# Patient Record
Sex: Female | Born: 1985 | Race: White | Hispanic: No | Marital: Married | Smoking: Never smoker
Health system: Southern US, Community
[De-identification: ages and names within clinical notes are randomized; demographics above are authoritative.]

## PROBLEM LIST (undated history)

## (undated) ENCOUNTER — Inpatient Hospital Stay (HOSPITAL_COMMUNITY): Payer: Self-pay

## (undated) DIAGNOSIS — K219 Gastro-esophageal reflux disease without esophagitis: Secondary | ICD-10-CM

## (undated) DIAGNOSIS — O24419 Gestational diabetes mellitus in pregnancy, unspecified control: Secondary | ICD-10-CM

## (undated) DIAGNOSIS — G43109 Migraine with aura, not intractable, without status migrainosus: Secondary | ICD-10-CM

## (undated) DIAGNOSIS — Z8759 Personal history of other complications of pregnancy, childbirth and the puerperium: Secondary | ICD-10-CM

## (undated) DIAGNOSIS — Z319 Encounter for procreative management, unspecified: Secondary | ICD-10-CM

## (undated) DIAGNOSIS — R87629 Unspecified abnormal cytological findings in specimens from vagina: Secondary | ICD-10-CM

## (undated) DIAGNOSIS — F419 Anxiety disorder, unspecified: Secondary | ICD-10-CM

## (undated) DIAGNOSIS — Z8619 Personal history of other infectious and parasitic diseases: Secondary | ICD-10-CM

## (undated) DIAGNOSIS — K2960 Other gastritis without bleeding: Secondary | ICD-10-CM

## (undated) DIAGNOSIS — I10 Essential (primary) hypertension: Secondary | ICD-10-CM

## (undated) HISTORY — PX: LEEP: SHX91

## (undated) HISTORY — DX: Other gastritis without bleeding: K29.60

## (undated) HISTORY — DX: Migraine with aura, not intractable, without status migrainosus: G43.109

## (undated) HISTORY — DX: Personal history of other infectious and parasitic diseases: Z86.19

## (undated) HISTORY — PX: TONSILLECTOMY: SUR1361

## (undated) HISTORY — DX: Essential (primary) hypertension: I10

## (undated) HISTORY — PX: DILATION AND CURETTAGE OF UTERUS: SHX78

## (undated) HISTORY — DX: Gestational diabetes mellitus in pregnancy, unspecified control: O24.419

## (undated) HISTORY — DX: Unspecified abnormal cytological findings in specimens from vagina: R87.629

## (undated) HISTORY — PX: PARTIAL HYSTERECTOMY: SHX80

## (undated) HISTORY — DX: Personal history of other complications of pregnancy, childbirth and the puerperium: Z87.59

---

## 2012-10-27 NOTE — L&D Delivery Note (Signed)
Delivery Note  SVD viable female Apgars 8,9 over 2nd degree ML lac.  Placenta delivered spontaneously intact with 3VC. Repair with 2-0 Chromic with good support and hemostasis noted and R/V exam confirms.  PH art was sent.  [place] cord blood was not done.  Mother and baby were doing well.  EBL 300cc  Cheikh Bramble, MD 

## 2013-02-08 ENCOUNTER — Encounter (HOSPITAL_COMMUNITY): Payer: Self-pay | Admitting: *Deleted

## 2013-02-08 ENCOUNTER — Inpatient Hospital Stay (HOSPITAL_COMMUNITY)
Admission: AD | Admit: 2013-02-08 | Discharge: 2013-02-08 | Disposition: A | Discharge status: Home / Self Care | Payer: No Typology Code available for payment source | Source: Ambulatory Visit | Attending: Obstetrics and Gynecology | Admitting: Obstetrics and Gynecology

## 2013-02-08 DIAGNOSIS — O99891 Other specified diseases and conditions complicating pregnancy: Secondary | ICD-10-CM | POA: Insufficient documentation

## 2013-02-08 DIAGNOSIS — A084 Viral intestinal infection, unspecified: Secondary | ICD-10-CM

## 2013-02-08 DIAGNOSIS — K5289 Other specified noninfective gastroenteritis and colitis: Secondary | ICD-10-CM | POA: Insufficient documentation

## 2013-02-08 DIAGNOSIS — A088 Other specified intestinal infections: Secondary | ICD-10-CM

## 2013-02-08 DIAGNOSIS — R197 Diarrhea, unspecified: Secondary | ICD-10-CM | POA: Insufficient documentation

## 2013-02-08 HISTORY — DX: Anxiety disorder, unspecified: F41.9

## 2013-02-08 HISTORY — DX: Gastro-esophageal reflux disease without esophagitis: K21.9

## 2013-02-08 HISTORY — DX: Encounter for procreative management, unspecified: Z31.9

## 2013-02-08 LAB — CBC WITH DIFFERENTIAL/PLATELET
Eosinophils Relative: 0 % (ref 0–5)
HCT: 40.3 % (ref 36.0–46.0)
Hemoglobin: 14.1 g/dL (ref 12.0–15.0)
Lymphocytes Relative: 14 % (ref 12–46)
Lymphs Abs: 1.2 10*3/uL (ref 0.7–4.0)
MCH: 30.1 pg (ref 26.0–34.0)
MCV: 86.1 fL (ref 78.0–100.0)
Monocytes Relative: 5 % (ref 3–12)
Platelets: 214 10*3/uL (ref 150–400)
RBC: 4.68 MIL/uL (ref 3.87–5.11)
WBC: 8.1 10*3/uL (ref 4.0–10.5)

## 2013-02-08 LAB — URINALYSIS, ROUTINE W REFLEX MICROSCOPIC
Bilirubin Urine: NEGATIVE
Glucose, UA: NEGATIVE mg/dL
Hgb urine dipstick: NEGATIVE
Specific Gravity, Urine: 1.02 (ref 1.005–1.030)
Urobilinogen, UA: 0.2 mg/dL (ref 0.0–1.0)
pH: 6 (ref 5.0–8.0)

## 2013-02-08 MED ORDER — ONDANSETRON HCL 4 MG/2ML IJ SOLN
4.0000 mg | Freq: Once | INTRAMUSCULAR | Status: AC
  Administered 2013-02-08: 4 mg via INTRAVENOUS
  Filled 2013-02-08: qty 2

## 2013-02-08 MED ORDER — LACTATED RINGERS IV BOLUS (SEPSIS)
1000.0000 mL | Freq: Once | INTRAVENOUS | Status: AC
  Administered 2013-02-08: 1000 mL via INTRAVENOUS

## 2013-02-08 NOTE — MAU Provider Note (Signed)
History     CSN: [id number]  Arrival date and time: 02/08/13 1027   First Provider Initiated Contact with Patient 02/08/13 1102      Chief Complaint  Patient presents with  . Diarrhea   HPI Pt is insemination pt with HCG ~700 started with diarrhea yesterday and then having continued diarrhea today.  She has had chills but no fever.  She took Immodium and tylenol at 9:15 am. She has not had diarrhea since she took the Immodium.  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    Past Medical History  Diagnosis Date  . Anxiety   . Acid reflux   . Infertility management     Past Surgical History  Procedure Laterality Date  . Tonsillectomy    . Dilation and curettage of uterus      History reviewed. No pertinent family history.  History  Substance Use Topics  . Smoking status: Never Smoker   . Smokeless tobacco: Not on file  . Alcohol Use: No    Allergies:  Allergies  Allergen Reactions  . Latex Other (See Comments)    Rash, swelling itching    Prescriptions prior to admission  Medication Sig Dispense Refill  . acetaminophen (TYLENOL) 500 MG tablet Take 1,000 mg by mouth as needed for pain (pain).      Marland Kitchen loperamide (IMODIUM) 2 MG capsule Take 2 mg by mouth once. diarrhea      . Multiple Vitamins-Minerals (MULTIVITAMIN WITH MINERALS) tablet Take 1 tablet by mouth daily.      . Progesterone 50 MG SUPP Place 50 mg vaginally 2 (two) times daily.      . ranitidine (ZANTAC) 150 MG tablet Take 150 mg by mouth daily.        Review of Systems  Constitutional: Positive for chills. Negative for fever.  Gastrointestinal: Positive for nausea, vomiting and diarrhea. Negative for abdominal pain and constipation.  Genitourinary: Negative for dysuria and urgency.   Physical Exam   Blood pressure 137/77, pulse 111, temperature  98.1 F (36.7 C), temperature source Oral, resp. rate 18, height 5\' 5"  (1.651 m), weight 94.802 kg (209 lb).  Physical Exam  MAU Course  Procedures Results for orders placed during the hospital encounter of 02/08/13 (from the past 24 hour(s))  URINALYSIS, ROUTINE W REFLEX MICROSCOPIC     Status: Abnormal   Collection Time    02/08/13 10:42 AM      Result Value Range   Color, Urine YELLOW  YELLOW   APPearance CLOUDY (*) CLEAR   Specific Gravity, Urine 1.020  1.005 - 1.030   pH 6.0  5.0 - 8.0   Glucose, UA NEGATIVE  NEGATIVE mg/dL   Hgb urine dipstick NEGATIVE  NEGATIVE   Bilirubin Urine NEGATIVE  NEGATIVE   Ketones, ur NEGATIVE  NEGATIVE mg/dL   Protein, ur NEGATIVE  NEGATIVE mg/dL   Urobilinogen, UA 0.2  0.0 - 1.0 mg/dL   Nitrite NEGATIVE  NEGATIVE   Leukocytes, UA NEGATIVE  NEGATIVE  CBC WITH DIFFERENTIAL     Status: Abnormal   Collection Time    02/08/13 11:13 AM      Result Value Range   WBC 8.1  4.0 - 10.5 K/uL   RBC 4.68  3.87 - 5.11 MIL/uL   Hemoglobin 14.1  12.0 - 15.0 g/dL   HCT 24.4  01.0 -  46.0 %   MCV 86.1  78.0 - 100.0 fL   MCH 30.1  26.0 - 34.0 pg   MCHC 35.0  30.0 - 36.0 g/dL   RDW 86.5  78.4 - 69.6 %   Platelets 214  150 - 400 K/uL   Neutrophils Relative 81 (*) 43 - 77 %   Neutro Abs 6.6  1.7 - 7.7 K/uL   Lymphocytes Relative 14  12 - 46 %   Lymphs Abs 1.2  0.7 - 4.0 K/uL   Monocytes Relative 5  3 - 12 %   Monocytes Absolute 0.4  0.1 - 1.0 K/uL   Eosinophils Relative 0  0 - 5 %   Eosinophils Absolute 0.0  0.0 - 0.7 K/uL   Basophils Relative 0  0 - 1 %   Basophils Absolute 0.0  0.0 - 0.1 K/uL  pt given 1000cc bolus of LR and Zofran 4mg  IV- pt felt much better and ready to go home. Pt has not had any diarrhea or vomiting since pt in MAU Pt has zofran at home and will use prn.  Pt able to tolerate PO fluids and ready for d/c  Dr. Marcelle Overlie aware   Assessment and Plan  Viral gastroenteritis- Immodium and Zofran prn Pregnancy- f/u as scheduled for HCG  level with Dr. Tor Netters 02/08/2013, 11:02 AM

## 2013-02-08 NOTE — MAU Note (Addendum)
Yesterday started having diarrhea, nausea, no vomiting. States still having a lot of diarrhea. Took imodium and tylenol this morning. Had chills last night. States she has had pregnancy confirmed at MD office. She is using progesterone supps 2 X/day. Having BHCG drawn every other day. Has had 2 miscarriages.

## 2013-03-14 LAB — OB RESULTS CONSOLE RPR: RPR: NONREACTIVE

## 2013-03-14 LAB — OB RESULTS CONSOLE HIV ANTIBODY (ROUTINE TESTING): HIV: NONREACTIVE

## 2013-03-14 LAB — OB RESULTS CONSOLE HEPATITIS B SURFACE ANTIGEN: Hepatitis B Surface Ag: NEGATIVE

## 2013-03-14 LAB — OB RESULTS CONSOLE ABO/RH: RH Type: POSITIVE

## 2013-03-17 LAB — OB RESULTS CONSOLE GC/CHLAMYDIA
Chlamydia: NEGATIVE
Gonorrhea: NEGATIVE

## 2013-05-19 ENCOUNTER — Inpatient Hospital Stay (HOSPITAL_COMMUNITY): Payer: No Typology Code available for payment source

## 2013-05-19 ENCOUNTER — Inpatient Hospital Stay (HOSPITAL_COMMUNITY)
Admission: AD | Admit: 2013-05-19 | Discharge: 2013-05-19 | Disposition: A | Discharge status: Home / Self Care | Payer: No Typology Code available for payment source | Source: Ambulatory Visit | Attending: Obstetrics and Gynecology | Admitting: Obstetrics and Gynecology

## 2013-05-19 ENCOUNTER — Encounter (HOSPITAL_COMMUNITY): Payer: Self-pay | Admitting: *Deleted

## 2013-05-19 DIAGNOSIS — O3442 Maternal care for other abnormalities of cervix, second trimester: Secondary | ICD-10-CM

## 2013-05-19 DIAGNOSIS — O4690 Antepartum hemorrhage, unspecified, unspecified trimester: Secondary | ICD-10-CM

## 2013-05-19 DIAGNOSIS — N888 Other specified noninflammatory disorders of cervix uteri: Secondary | ICD-10-CM

## 2013-05-19 DIAGNOSIS — O209 Hemorrhage in early pregnancy, unspecified: Secondary | ICD-10-CM | POA: Insufficient documentation

## 2013-05-19 DIAGNOSIS — M549 Dorsalgia, unspecified: Secondary | ICD-10-CM | POA: Insufficient documentation

## 2013-05-19 DIAGNOSIS — O4692 Antepartum hemorrhage, unspecified, second trimester: Secondary | ICD-10-CM

## 2013-05-19 DIAGNOSIS — Z9889 Other specified postprocedural states: Secondary | ICD-10-CM

## 2013-05-19 NOTE — MAU Provider Note (Signed)
Chief Complaint: Vaginal Bleeding , First Provider Initiated Contact with Patient 05/19/13 0218     SUBJECTIVE HPI: [name] is a 27 y.o. [id number] at [redacted]w[redacted]d by LMP who presents with vaginal bleeding after BM tonight. Sure that is wasn't rectal bleeding. No further bleeding since then. Anatomy US showed no previa per pt. (Good historian). Denies fever, chills, vaginal discharge, LOF, pelvic pressure.   Past Medical History  Diagnosis Date  . Anxiety   . Acid reflux   . Infertility management    OB History   Grav Para Term Preterm Abortions TAB SAB Ect Mult Living   4 1  1 2  2   1      # Outc Date GA Lbr Len/2nd Wgt Sex Del Anes PTL Lv   1 PRE            2 SAB            3 SAB            4 CUR              Past Surgical History  Procedure Laterality Date  . Tonsillectomy    . Dilation and curettage of uterus     History   Social History  . Marital Status: Married    Spouse Name: N/A    Number of Children: N/A  . Years of Education: N/A   Occupational History  . Not on file.   Social History Main Topics  . Smoking status: Never Smoker   . Smokeless tobacco: Not on file  . Alcohol Use: No  . Drug Use: No  . Sexually Active: Not on file   Other Topics Concern  . Not on file   Social History Narrative  . No narrative on file   No current facility-administered medications on file prior to encounter.   Current Outpatient Prescriptions on File Prior to Encounter  Medication Sig Dispense Refill  . acetaminophen (TYLENOL) 500 MG tablet Take 1,000 mg by mouth as needed for pain (pain).      Marland Kitchen loperamide (IMODIUM) 2 MG capsule Take 2 mg by mouth once. diarrhea      . Multiple Vitamins-Minerals (MULTIVITAMIN WITH MINERALS) tablet Take 1 tablet by mouth daily.      . Progesterone 50 MG SUPP Place 50 mg vaginally 2 (two) times daily.      . ranitidine (ZANTAC) 150 MG tablet Take 150 mg by mouth daily.       Allergies  Allergen Reactions  . Latex Other (See  Comments)    Rash, swelling itching    ROS: Pertinent items in HPI  OBJECTIVE Blood pressure 125/64, pulse 84, temperature 98.2 F (36.8 C), temperature source Oral, resp. rate 16, height 5\' 6"  (1.676 m), weight 99.519 kg (219 lb 6.4 oz). GENERAL: Well-developed, well-nourished female in no acute distress.  HEENT: Normocephalic HEART: normal rate RESP: normal effort ABDOMEN: Soft, non-tender EXTREMITIES: Nontender, no edema NEURO: Alert and oriented SPECULUM EXAM: NEFG, physiologic discharge, no blood in vault, but cervix friable w/ exam. BIMANUAL: cervix closed; uterus normal size, no adnexal tenderness or masses FHR 159 by doppler.  LAB RESULTS NA  IMAGING US Ob Transvaginal  05/19/2013   *RADIOLOGY REPORT*  Clinical Data:  Vaginal bleeding.  The check cervical length. Estimated gestational age by LMP is 19 weeks 0 days.  LIMITED OBSTETRIC ULTRASOUND  Number of Fetuses: 1 Heart Rate: 160bpm Movement:  Fetal movement is observed. Presentation: Cephalic presentation.  The MATERNAL  FINDINGS: Cervix:  Cervix is closed.  Cervical length measures 3.3 cm. Cervical length with Valsalva measures 2.7 cm. Uterus/Adnexae:  Not visualized.  IMPRESSION: Cervical length measures 3.3 cm.  Cervix is closed.  This exam is performed on an emergent basis and does not comprehensively evaluate fetal size, dating, or anatomy; a follow- up complete OB US should be considered if further fetal assessment is warranted.   Original Report Authenticated By: Burman Nieves, M.D.   MAU COURSE  ASSESSMENT 1. Vaginal bleeding in pregnancy, second trimester   2. Friable cervix   3. History of LEEP (loop electrosurgical excision procedure) of cervix complicating pregnancy, second trimester     PLAN Discharge home in stable condition. Pelvic rest x 1 week.     Follow-up Information   Follow up with [facility],JAMES E, MD. (as scheduled or  as needed if symptoms worsen)    Contact information:   [address] [place] [address] [phone]       Follow up with [facility] [place] [facility]ERNITY ADMISSIONS. (As needed if symptoms worsen)    Contact information:   [address] [id number] [place] [address] [phone]       Medication List         acetaminophen 500 MG tablet  Commonly known as:  TYLENOL  Take 1,000 mg by mouth as needed for pain (pain).     loperamide 2 MG capsule  Commonly known as:  IMODIUM  Take 2 mg by mouth once. diarrhea     multivitamin with minerals tablet  Take 1 tablet by mouth daily.     Progesterone 50 MG Supp  Place 50 mg vaginally 2 (two) times daily.     ranitidine 150 MG tablet  Commonly known as:  ZANTAC  Take 150 mg by mouth daily.       [place], CNM 05/19/2013  3:27 AM

## 2013-05-19 NOTE — MAU Note (Addendum)
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 Pt states she vaginal bleeding that dripped in the toilet, pt states she wiped 3-4 times and it was less each time.pt states she went to the bathroom here and saw no bleeding

## 2013-05-19 NOTE — MAU Note (Signed)
Pt G4 P1 at 19wks, with vaginal bleeding after having a bowel movement tonight.  No bleeding at this time.  Denies recent intercourse.

## 2013-05-19 NOTE — Progress Notes (Signed)
Pt feels back pain is related to working all day

## 2013-07-09 ENCOUNTER — Encounter (HOSPITAL_COMMUNITY): Payer: Self-pay | Admitting: *Deleted

## 2013-07-09 ENCOUNTER — Inpatient Hospital Stay (HOSPITAL_COMMUNITY)
Admission: AD | Admit: 2013-07-09 | Discharge: 2013-07-09 | Disposition: A | Discharge status: Home / Self Care | Payer: No Typology Code available for payment source | Source: Ambulatory Visit | Attending: Obstetrics and Gynecology | Admitting: Obstetrics and Gynecology

## 2013-07-09 DIAGNOSIS — R000 Tachycardia, unspecified: Secondary | ICD-10-CM | POA: Insufficient documentation

## 2013-07-09 DIAGNOSIS — Z3490 Encounter for supervision of normal pregnancy, unspecified, unspecified trimester: Secondary | ICD-10-CM

## 2013-07-09 DIAGNOSIS — O99891 Other specified diseases and conditions complicating pregnancy: Secondary | ICD-10-CM | POA: Insufficient documentation

## 2013-07-09 LAB — URINALYSIS, ROUTINE W REFLEX MICROSCOPIC
Glucose, UA: 100 mg/dL — AB
Hgb urine dipstick: NEGATIVE
Ketones, ur: NEGATIVE mg/dL
Leukocytes, UA: NEGATIVE
pH: 6 (ref 5.0–8.0)

## 2013-07-09 MED ORDER — PROMETHAZINE HCL 25 MG/ML IJ SOLN
Freq: Once | INTRAVENOUS | Status: AC
  Administered 2013-07-09: 02:00:00 via INTRAVENOUS
  Filled 2013-07-09: qty 1000

## 2013-07-09 NOTE — MAU Provider Note (Signed)
  History     CSN: [id number]  Arrival date and time: 07/09/13 0010   None     Chief Complaint  Patient presents with  . Tachycardia   HPI Comments: [name] 27 y.o. [id number] presents to MAU due to elevated pulse rate. She was concerned today, all day, at work and felt dizzy and faint. She had some food/ fluids but not enough due to nausea.         Past Medical History  Diagnosis Date  . Anxiety   . Acid reflux   . Infertility management     Past Surgical History  Procedure Laterality Date  . Tonsillectomy    . Dilation and curettage of uterus      Family History  Problem Relation Age of Onset  . Diabetes Mother   . Cancer Mother   . Diabetes Father   . Heart disease Father   . Diabetes Maternal Grandmother     History  Substance Use Topics  . Smoking status: Never Smoker   . Smokeless tobacco: Not on file  . Alcohol Use: No    Allergies:  Allergies  Allergen Reactions  . Latex Other (See Comments)    Rash, swelling itching    Prescriptions prior to admission  Medication Sig Dispense Refill  . acetaminophen (TYLENOL) 500 MG tablet Take 1,000 mg by mouth as needed for pain (pain).      Marland Kitchen loperamide (IMODIUM) 2 MG capsule Take 2 mg by mouth once. diarrhea      . Multiple Vitamins-Minerals (MULTIVITAMIN WITH MINERALS) tablet Take 1 tablet by mouth daily.      . Progesterone 50 MG SUPP Place 50 mg vaginally 2 (two) times daily.      . ranitidine (ZANTAC) 150 MG tablet Take 150 mg by mouth daily.        Review of Systems  Respiratory: Negative.   Cardiovascular:       Elevated heart rate  Gastrointestinal: Positive for nausea.  Neurological: Positive for weakness.  Psychiatric/Behavioral:       Anxiety   Physical Exam   Blood pressure 121/74, pulse 106, temperature 97.7 F (36.5 C), resp. rate 20, height 5\' 5"  (1.651 m), weight 230 lb 9.6 oz (104.599 kg), SpO2 100.00%.  Physical Exam  Constitutional: She is oriented to person, place,  and time. She appears well-developed and well-nourished. No distress.  HENT:  Head: Normocephalic and atraumatic.  Eyes: Pupils are equal, round, and reactive to light.  Cardiovascular: Normal rate and regular rhythm.   Respiratory: Effort normal and breath sounds normal. No respiratory distress. She has no wheezes. She has no rales.  GI: Soft.  Genitourinary:  Not examined  Neurological: She is alert and oriented to person, place, and time.  Skin: Skin is warm and dry.  Psychiatric:  anxious  Heart rate came down into the 80's after fluids  MAU Course  Procedures  MDM IVF LR with 25mg  phenergan  Assessment and Plan  A: Tachycardia in pregnancy P: IVF with phenergan   [name] 07/09/2013, 12:33 AM

## 2013-07-09 NOTE — MAU Note (Signed)
All day my heartrate has been fast. 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 I have felt alittle dizzy at times. This is first time this has ever happened. I do have a low lying placenta.

## 2013-08-03 ENCOUNTER — Encounter: Payer: No Typology Code available for payment source | Attending: Obstetrics and Gynecology

## 2013-08-03 VITALS — Ht 65.0 in | Wt 234.7 lb

## 2013-08-03 DIAGNOSIS — O99810 Abnormal glucose complicating pregnancy: Secondary | ICD-10-CM | POA: Insufficient documentation

## 2013-08-03 DIAGNOSIS — Z713 Dietary counseling and surveillance: Secondary | ICD-10-CM | POA: Insufficient documentation

## 2013-08-11 NOTE — Progress Notes (Signed)
  Patient was seen on 08/03/13 for Gestational Diabetes self-management education. The following learning objectives were met by the patient during this course:   States the definition of Gestational Diabetes  States why dietary management is important in controlling blood glucose  Describes the effects of carbohydrates on blood glucose levels  Demonstrates ability to create a balanced meal plan  Demonstrates carbohydrate counting   States when to check blood glucose levels  Demonstrates proper blood glucose monitoring techniques  States the effect of stress and exercise on blood glucose levels  States the importance of limiting caffeine and abstaining from alcohol and smoking  Plan:  Aim for 2 Carb Choices per meal (30 grams) +/- 1 either way for breakfast Aim for 3 Carb Choices per meal (45 grams) +/- 1 either way from lunch and dinner Aim for 1-2 Carbs per snack Begin reading food labels for Total Carbohydrate and sugar grams of foods Consider  increasing your activity level by walking daily as tolerated Begin checking BG before breakfast and 1-2 hours after first bit of breakfast, lunch and dinner after  as directed by MD  Take medication  as directed by MD  Blood glucose monitor given:  One Touch Ultra Mini Self Monitoring Kit Lot # [id number] X Exp: 03/2014 Blood glucose reading: 95mg /dl  Patient instructed to monitor glucose levels: FBS: 60 - <90 1 hour: <140 2 hour: <120  Patient received the following handouts:  Nutrition Diabetes and Pregnancy  Carbohydrate Counting List  Meal Planning worksheet  Patient will be seen for follow-up as needed.

## 2013-09-19 LAB — OB RESULTS CONSOLE GBS: GBS: POSITIVE

## 2013-10-01 ENCOUNTER — Encounter (HOSPITAL_COMMUNITY): Payer: Self-pay

## 2013-10-01 ENCOUNTER — Inpatient Hospital Stay (HOSPITAL_COMMUNITY)
Admission: AD | Admit: 2013-10-01 | Discharge: 2013-10-01 | Disposition: A | Discharge status: Home / Self Care | Payer: No Typology Code available for payment source | Source: Ambulatory Visit | Attending: Obstetrics and Gynecology | Admitting: Obstetrics and Gynecology

## 2013-10-01 DIAGNOSIS — O26899 Other specified pregnancy related conditions, unspecified trimester: Secondary | ICD-10-CM

## 2013-10-01 DIAGNOSIS — O479 False labor, unspecified: Secondary | ICD-10-CM | POA: Insufficient documentation

## 2013-10-01 DIAGNOSIS — O9989 Other specified diseases and conditions complicating pregnancy, childbirth and the puerperium: Secondary | ICD-10-CM

## 2013-10-01 DIAGNOSIS — R109 Unspecified abdominal pain: Secondary | ICD-10-CM | POA: Insufficient documentation

## 2013-10-01 LAB — URINE MICROSCOPIC-ADD ON

## 2013-10-01 LAB — URINALYSIS, ROUTINE W REFLEX MICROSCOPIC
Bilirubin Urine: NEGATIVE
Glucose, UA: NEGATIVE mg/dL
Nitrite: NEGATIVE
Specific Gravity, Urine: >1.03 — ABNORMAL HIGH (ref 1.005–1.030)
pH: 6 (ref 5.0–8.0)

## 2013-10-01 NOTE — MAU Note (Signed)
Pt states here for sharp pain in middle of abdomen. Has had u/c's, however feels like this is different. Had one instance yesterday pm and a few this am. Pain scared pt and she came for eval.

## 2013-10-07 ENCOUNTER — Inpatient Hospital Stay (HOSPITAL_COMMUNITY)
Admission: AD | Admit: 2013-10-07 | Discharge: 2013-10-09 | DRG: 775 | Disposition: A | Discharge status: Home / Self Care | Payer: No Typology Code available for payment source | Source: Ambulatory Visit | Attending: Obstetrics and Gynecology | Admitting: Obstetrics and Gynecology

## 2013-10-07 ENCOUNTER — Inpatient Hospital Stay (HOSPITAL_COMMUNITY): Payer: No Typology Code available for payment source | Admitting: Anesthesiology

## 2013-10-07 ENCOUNTER — Encounter (HOSPITAL_COMMUNITY): Payer: No Typology Code available for payment source | Admitting: Anesthesiology

## 2013-10-07 ENCOUNTER — Encounter (HOSPITAL_COMMUNITY): Payer: Self-pay | Admitting: *Deleted

## 2013-10-07 DIAGNOSIS — Z22330 Carrier of Group B streptococcus: Secondary | ICD-10-CM

## 2013-10-07 DIAGNOSIS — O99814 Abnormal glucose complicating childbirth: Secondary | ICD-10-CM | POA: Diagnosis present

## 2013-10-07 DIAGNOSIS — O99892 Other specified diseases and conditions complicating childbirth: Secondary | ICD-10-CM | POA: Diagnosis present

## 2013-10-07 LAB — RPR: RPR Ser Ql: NONREACTIVE

## 2013-10-07 LAB — CBC
HCT: 36.3 % (ref 36.0–46.0)
MCV: 82.1 fL (ref 78.0–100.0)
Platelets: 254 10*3/uL (ref 150–400)
RBC: 4.42 MIL/uL (ref 3.87–5.11)
RDW: 14.3 % (ref 11.5–15.5)
WBC: 12.8 10*3/uL — ABNORMAL HIGH (ref 4.0–10.5)

## 2013-10-07 MED ORDER — DIPHENHYDRAMINE HCL 50 MG/ML IJ SOLN: 12.5000 mg | INTRAMUSCULAR | Status: DC | PRN

## 2013-10-07 MED ORDER — OXYCODONE-ACETAMINOPHEN 5-325 MG PO TABS: 1.0000 | ORAL_TABLET | ORAL | Status: DC | PRN

## 2013-10-07 MED ORDER — OXYTOCIN 40 UNITS IN LACTATED RINGERS INFUSION - SIMPLE MED
62.5000 mL/h | INTRAVENOUS | Status: DC
  Filled 2013-10-07 (×2): qty 1000

## 2013-10-07 MED ORDER — LANOLIN HYDROUS EX OINT: TOPICAL_OINTMENT | CUTANEOUS | Status: DC | PRN

## 2013-10-07 MED ORDER — WITCH HAZEL-GLYCERIN EX PADS: 1.0000 "application " | MEDICATED_PAD | CUTANEOUS | Status: DC | PRN

## 2013-10-07 MED ORDER — EPHEDRINE 5 MG/ML INJ: 10.0000 mg | INTRAVENOUS | Status: DC | PRN

## 2013-10-07 MED ORDER — BENZOCAINE-MENTHOL 20-0.5 % EX AERO
1.0000 "application " | INHALATION_SPRAY | CUTANEOUS | Status: DC | PRN
  Administered 2013-10-08: 1 via TOPICAL
  Filled 2013-10-07: qty 56

## 2013-10-07 MED ORDER — FENTANYL 2.5 MCG/ML BUPIVACAINE 1/10 % EPIDURAL INFUSION (WH - ANES)
14.0000 mL/h | INTRAMUSCULAR | Status: DC | PRN
  Administered 2013-10-07: 14 mL/h via EPIDURAL
  Filled 2013-10-07: qty 125

## 2013-10-07 MED ORDER — DIBUCAINE 1 % RE OINT: 1.0000 "application " | TOPICAL_OINTMENT | RECTAL | Status: DC | PRN

## 2013-10-07 MED ORDER — ONDANSETRON HCL 4 MG/2ML IJ SOLN: 4.0000 mg | INTRAMUSCULAR | Status: DC | PRN

## 2013-10-07 MED ORDER — MEASLES, MUMPS & RUBELLA VAC ~~LOC~~ INJ
0.5000 mL | INJECTION | Freq: Once | SUBCUTANEOUS | Status: AC
  Administered 2013-10-09: 0.5 mL via SUBCUTANEOUS
  Filled 2013-10-07 (×2): qty 0.5

## 2013-10-07 MED ORDER — OSELTAMIVIR PHOSPHATE 75 MG PO CAPS
75.0000 mg | ORAL_CAPSULE | Freq: Every day | ORAL | Status: DC
  Administered 2013-10-08 – 2013-10-09 (×2): 75 mg via ORAL
  Filled 2013-10-07 (×3): qty 1

## 2013-10-07 MED ORDER — PHENYLEPHRINE 40 MCG/ML (10ML) SYRINGE FOR IV PUSH (FOR BLOOD PRESSURE SUPPORT): 80.0000 ug | PREFILLED_SYRINGE | INTRAVENOUS | Status: DC | PRN

## 2013-10-07 MED ORDER — SODIUM CHLORIDE 0.9 % IV SOLN
2.0000 g | Freq: Four times a day (QID) | INTRAVENOUS | Status: DC
  Administered 2013-10-07: 2 g via INTRAVENOUS
  Filled 2013-10-07 (×3): qty 2000

## 2013-10-07 MED ORDER — LIDOCAINE HCL (PF) 1 % IJ SOLN
INTRAMUSCULAR | Status: DC | PRN
  Administered 2013-10-07 (×2): 5 mL

## 2013-10-07 MED ORDER — MEDROXYPROGESTERONE ACETATE 150 MG/ML IM SUSP: 150.0000 mg | INTRAMUSCULAR | Status: DC | PRN

## 2013-10-07 MED ORDER — IBUPROFEN 600 MG PO TABS: 600.0000 mg | ORAL_TABLET | Freq: Four times a day (QID) | ORAL | Status: DC | PRN

## 2013-10-07 MED ORDER — SIMETHICONE 80 MG PO CHEW: 80.0000 mg | CHEWABLE_TABLET | ORAL | Status: DC | PRN

## 2013-10-07 MED ORDER — LACTATED RINGERS IV SOLN
INTRAVENOUS | Status: DC
  Administered 2013-10-07: 16:00:00 via INTRAVENOUS

## 2013-10-07 MED ORDER — LACTATED RINGERS IV SOLN: 500.0000 mL | INTRAVENOUS | Status: DC | PRN

## 2013-10-07 MED ORDER — EPHEDRINE 5 MG/ML INJ
10.0000 mg | INTRAVENOUS | Status: DC | PRN
  Filled 2013-10-07: qty 4

## 2013-10-07 MED ORDER — OXYTOCIN BOLUS FROM INFUSION
500.0000 mL | INTRAVENOUS | Status: DC
  Administered 2013-10-07: 500 mL via INTRAVENOUS

## 2013-10-07 MED ORDER — DIPHENHYDRAMINE HCL 25 MG PO CAPS: 25.0000 mg | ORAL_CAPSULE | Freq: Four times a day (QID) | ORAL | Status: DC | PRN

## 2013-10-07 MED ORDER — CITRIC ACID-SODIUM CITRATE 334-500 MG/5ML PO SOLN: 30.0000 mL | ORAL | Status: DC | PRN

## 2013-10-07 MED ORDER — OXYTOCIN 40 UNITS IN LACTATED RINGERS INFUSION - SIMPLE MED
1.0000 m[IU]/min | INTRAVENOUS | Status: DC
  Administered 2013-10-07: 2 m[IU]/min via INTRAVENOUS

## 2013-10-07 MED ORDER — LIDOCAINE HCL (PF) 1 % IJ SOLN
30.0000 mL | INTRAMUSCULAR | Status: DC | PRN
  Administered 2013-10-07: 30 mL via SUBCUTANEOUS
  Filled 2013-10-07 (×2): qty 30

## 2013-10-07 MED ORDER — ONDANSETRON HCL 4 MG PO TABS: 4.0000 mg | ORAL_TABLET | ORAL | Status: DC | PRN

## 2013-10-07 MED ORDER — TETANUS-DIPHTH-ACELL PERTUSSIS 5-2.5-18.5 LF-MCG/0.5 IM SUSP: 0.5000 mL | Freq: Once | INTRAMUSCULAR | Status: DC

## 2013-10-07 MED ORDER — PRENATAL MULTIVITAMIN CH
1.0000 | ORAL_TABLET | Freq: Every day | ORAL | Status: DC
  Administered 2013-10-08 – 2013-10-09 (×2): 1 via ORAL
  Filled 2013-10-07 (×2): qty 1

## 2013-10-07 MED ORDER — ACETAMINOPHEN 325 MG PO TABS: 650.0000 mg | ORAL_TABLET | ORAL | Status: DC | PRN

## 2013-10-07 MED ORDER — ONDANSETRON HCL 4 MG/2ML IJ SOLN: 4.0000 mg | Freq: Four times a day (QID) | INTRAMUSCULAR | Status: DC | PRN

## 2013-10-07 MED ORDER — LACTATED RINGERS IV SOLN: 500.0000 mL | Freq: Once | INTRAVENOUS | Status: DC

## 2013-10-07 MED ORDER — TERBUTALINE SULFATE 1 MG/ML IJ SOLN: 0.2500 mg | Freq: Once | INTRAMUSCULAR | Status: DC | PRN

## 2013-10-07 MED ORDER — PHENYLEPHRINE 40 MCG/ML (10ML) SYRINGE FOR IV PUSH (FOR BLOOD PRESSURE SUPPORT)
80.0000 ug | PREFILLED_SYRINGE | INTRAVENOUS | Status: DC | PRN
  Filled 2013-10-07: qty 10

## 2013-10-07 MED ORDER — ZOLPIDEM TARTRATE 5 MG PO TABS: 5.0000 mg | ORAL_TABLET | Freq: Every evening | ORAL | Status: DC | PRN

## 2013-10-07 MED ORDER — FLEET ENEMA 7-19 GM/118ML RE ENEM: 1.0000 | ENEMA | RECTAL | Status: DC | PRN

## 2013-10-07 MED ORDER — IBUPROFEN 600 MG PO TABS
600.0000 mg | ORAL_TABLET | Freq: Four times a day (QID) | ORAL | Status: DC
Start: 2013-10-08 — End: 2013-10-09
  Administered 2013-10-07 – 2013-10-09 (×7): 600 mg via ORAL
  Filled 2013-10-07 (×7): qty 1

## 2013-10-07 MED ORDER — SENNOSIDES-DOCUSATE SODIUM 8.6-50 MG PO TABS
2.0000 | ORAL_TABLET | ORAL | Status: DC
Start: 2013-10-08 — End: 2013-10-09
  Administered 2013-10-08 – 2013-10-09 (×2): 2 via ORAL
  Filled 2013-10-07 (×2): qty 2

## 2013-10-07 NOTE — Anesthesia Preprocedure Evaluation (Signed)
Anesthesia Evaluation  Patient identified by MRN, date of birth, ID band Patient awake    Reviewed: Allergy & Precautions, H&P , Patient's Chart, lab work & pertinent test results  Airway Mallampati: III TM Distance: >3 FB Neck ROM: full    Dental   Pulmonary  breath sounds clear to auscultation        Cardiovascular Rhythm:regular Rate:Normal     Neuro/Psych Anxiety    GI/Hepatic GERD-  ,  Endo/Other  diabetesMorbid obesity  Renal/GU      Musculoskeletal   Abdominal   Peds  Hematology   Anesthesia Other Findings   Reproductive/Obstetrics (+) Pregnancy                           Anesthesia Physical Anesthesia Plan  ASA: III  Anesthesia Plan: Epidural   Post-op Pain Management:    Induction:   Airway Management Planned:   Additional Equipment:   Intra-op Plan:   Post-operative Plan:   Informed Consent: I have reviewed the patients History and Physical, chart, labs and discussed the procedure including the risks, benefits and alternatives for the proposed anesthesia with the patient or authorized representative who has indicated his/her understanding and acceptance.     Plan Discussed with:   Anesthesia Plan Comments:         Anesthesia Quick Evaluation

## 2013-10-07 NOTE — H&P (Signed)
[name] is a 27 y.o. female presenting for labor with ctxs q 4-5 minutes and Cx 4 cm.  Hx of gest dm diet controlled with excellent control.  GBS + (clinda resistent) . History OB History   Grav Para Term Preterm Abortions TAB SAB Ect Mult Living   4 1  1 2  2   1      Past Medical History  Diagnosis Date  . Anxiety   . Acid reflux   . Infertility management   . Gestational diabetes    Past Surgical History  Procedure Laterality Date  . Tonsillectomy    . Dilation and curettage of uterus     Family History: family history includes Cancer in her mother; Diabetes in her father, maternal grandmother, and mother; Heart disease in her father. Social History:  reports that she has never smoked. She does not have any smokeless tobacco history on file. She reports that she does not drink alcohol or use illicit drugs.   Prenatal Transfer Tool  Maternal Diabetes: Yes:  Diabetes Type:  Diet controlled Genetic Screening: Normal Maternal Ultrasounds/Referrals: Normal Fetal Ultrasounds or other Referrals:  None Maternal Substance Abuse:  No Significant Maternal Medications:  None Significant Maternal Lab Results:  None Other Comments:  None  ROS  Dilation: 4 Effacement (%): 80 Station: -3 Exam by:: dr. Rana Snare Blood pressure 151/95, pulse 98, temperature 98.7 F (37.1 C), temperature source Oral, resp. rate 18, height 5\' 5"  (1.651 m), weight 108.41 kg (239 lb). Exam Physical Exam  Prenatal labs: ABO, Rh: A/Positive/-- (05/19 0000) Antibody: Negative (05/19 0000) Rubella: Immune, Nonimmune (05/19 0000) RPR: Nonreactive (05/19 0000)  HBsAg: Negative (05/19 0000)  HIV: Non-reactive (05/19 0000)  GBS: Positive (11/24 0000)   Assessment/Plan: IUP at term in active labor A2DM - glc monitor per guidelines Anticipate SVD IV Amp for GBS (pt allergic to cephalosporins but can take amoxil)   Priscila Bean C 10/07/2013, 6:12 PM

## 2013-10-07 NOTE — Anesthesia Procedure Notes (Signed)
Epidural Patient location during procedure: OB Start time: 10/07/2013 9:03 PM  Staffing Anesthesiologist: Brayton Caves Performed by: anesthesiologist   Preanesthetic Checklist Completed: patient identified, site marked, surgical consent, pre-op evaluation, timeout performed, IV checked, risks and benefits discussed and monitors and equipment checked  Epidural Patient position: sitting Prep: site prepped and draped and DuraPrep Patient monitoring: continuous pulse ox and blood pressure Approach: midline Injection technique: LOR air  Needle:  Needle type: Tuohy  Needle gauge: 17 G Needle length: 9 cm and 9 Needle insertion depth: 7 cm Catheter type: closed end flexible Catheter size: 19 Gauge Catheter at skin depth: 13 cm Test dose: negative  Assessment Events: blood not aspirated, injection not painful, no injection resistance, negative IV test and no paresthesia  Additional Notes Patient identified.  Risk benefits discussed including failed block, incomplete pain control, headache, nerve damage, paralysis, blood pressure changes, nausea, vomiting, reactions to medication both toxic or allergic, and postpartum back pain.  Patient expressed understanding and wished to proceed.  All questions were answered.  Sterile technique used throughout procedure and epidural site dressed with sterile barrier dressing. No paresthesia or other complications noted.The patient did not experience any signs of intravascular injection such as tinnitus or metallic taste in mouth nor signs of intrathecal spread such as rapid motor block. Please see nursing notes for vital signs.

## 2013-10-07 NOTE — MAU Note (Addendum)
Contractions for over a week. Was 3cm in office today. Still contracting. States daughter has the flu. Patient states she and husband are on tamiflu.

## 2013-10-08 ENCOUNTER — Encounter (HOSPITAL_COMMUNITY): Payer: Self-pay | Admitting: *Deleted

## 2013-10-08 LAB — CBC
HCT: 32.1 % — ABNORMAL LOW (ref 36.0–46.0)
MCV: 82.1 fL (ref 78.0–100.0)
Platelets: 220 10*3/uL (ref 150–400)
RBC: 3.91 MIL/uL (ref 3.87–5.11)
WBC: 16.3 10*3/uL — ABNORMAL HIGH (ref 4.0–10.5)

## 2013-10-08 NOTE — Anesthesia Postprocedure Evaluation (Signed)
Anesthesia Post Note  Patient: [name]  Procedure(s) Performed: * No procedures listed *  Anesthesia type: Epidural  Patient location: Mother/Baby  Post pain: Pain level controlled  Post assessment: Post-op Vital signs reviewed  Last Vitals:  Filed Vitals:   10/08/13 0549  BP: 133/79  Pulse: 83  Temp: 36.6 C  Resp:     Post vital signs: Reviewed  Level of consciousness:alert  Complications: No apparent anesthesia complications

## 2013-10-08 NOTE — Progress Notes (Signed)
Clinical Social Work Department PSYCHOSOCIAL ASSESSMENT - MATERNAL/CHILD 10/08/2013  Patient:  Chambers,[name]  Account Number:  [id number]  Admit Date:  10/07/2013  Childs Name:   [name]    Clinical Social Worker:  Maize Brittingham, LCSW   Date/Time:  10/08/2013 11:50 AM  Date Referred:  10/07/2013   Referral source  Central Nursery     Referred reason  Hx of Anxiety   Other referral source:    I:  FAMILY / HOME ENVIRONMENT Child's legal guardian:  PARENT  Guardian - Name Guardian - Age Guardian - Address  Chambers, [name] [address]  [place] [address]  Chambers, [name]  same as above   Other household support members/support persons Other support:    II  PSYCHOSOCIAL DATA Information Source:    Financial and Community Resources Employment:   Both parents employed   Financial resources:  Private Insurance If Medicaid - County:    School / Grade:   Maternity Care Coordinator / Child Services Coordination / Early Interventions:  Cultural issues impacting care:    III  STRENGTHS Strengths  Supportive family/friends  Home prepared for Child (including basic supplies)  Adequate Resources   Strength comment:    IV  RISK FACTORS AND CURRENT PROBLEMS Current Problem:       V  SOCIAL WORK ASSESSMENT Met with mother who was pleasant and receptive to social work intervention.  She is married and have one other dependent age 7.  Spouse was present at time of CSW visit. Both parents are employed.   Mother states that she was treated for anxiety many years ago.  She reportedly took medication for a short period of time.  She denies hx of psychiatric hospitalization.    She denies any current symptoms of anxiety.   She also denies any hx of substance abuse.  Mother reports extensive family support.     Spoke with parents regarding PPD and provided them with information and resources.   Father plans to take off 4 weeks from work.   No acute social concerns  noted or reported at this time.  Parents informed of CSW availability.      VI SOCIAL WORK PLAN Social Work Plan  No Further Intervention Required / No Barriers to Discharge   [name] J, LCSW  

## 2013-10-08 NOTE — Progress Notes (Signed)
Post Partum Day 1 Subjective: no complaints, up ad lib, voiding and tolerating PO  Objective: Blood pressure 133/79, pulse 83, temperature 97.9 F (36.6 C), temperature source Axillary, resp. rate 18, height 5\' 5"  (1.651 m), weight 108.41 kg (239 lb), SpO2 97.00%, unknown if currently breastfeeding.  Physical Exam:  General: alert, cooperative, appears stated age and no distress Lochia: appropriate Uterine Fundus: firm DVT Evaluation: No evidence of DVT seen on physical exam.   Recent Labs  10/07/13 1600 10/08/13 0611  HGB 12.3 10.7*  HCT 36.3 32.1*    Assessment/Plan: Plan for discharge tomorrow and Breastfeeding No Sxs of Flu.  S/P vax and on Tamiflu.  Daughter at home with Flu A   LOS: 1 day   Kiri Hinderliter C 10/08/2013, 9:56 AM

## 2013-10-09 NOTE — Lactation Note (Signed)
This note was copied from the chart of [name]. Lactation Consultation Note  Patient Name: [name] [id number] Date: 10/09/2013 Reason for consult: Initial assessment BF basics reviewed with parents. Encouraged to continue to BF with feeding ques, cluster feeding reviewed. Assisted Mom with latching baby and obtaining good depth. Baby is a tongue thruster, but with repeated attempts will latch well, demonstrating a good rhythmic suck.  Lactation brochure left for review, advised of OP services and support group. Advised to ask for assist as needed.   Maternal Data Formula Feeding for Exclusion: No Infant to breast within first hour of birth: Yes Has patient been taught Hand Expression?: Yes Does the patient have breastfeeding experience prior to this delivery?: Yes  Feeding Feeding Type: Breast Fed Length of feed: 12 min  LATCH Score/Interventions Latch: Repeated attempts needed to sustain latch, nipple held in mouth throughout feeding, stimulation needed to elicit sucking reflex. Intervention(s): Adjust position;Assist with latch;Breast massage;Breast compression  Audible Swallowing: A few with stimulation  Type of Nipple: Everted at rest and after stimulation  Comfort (Breast/Nipple): Soft / non-tender     Hold (Positioning): Assistance needed to correctly position infant at breast and maintain latch. Intervention(s): Breastfeeding basics reviewed;Support Pillows;Position options;Skin to skin  LATCH Score: 7  Lactation Tools Discussed/Used     Consult Status Consult Status: Follow-up Date: 10/09/13 Follow-up type: In-patient    Alfred Levins 10/09/2013, 12:46 AM

## 2013-10-09 NOTE — Discharge Summary (Signed)
Obstetric Discharge Summary Reason for Admission: onset of labor Prenatal Procedures: none Intrapartum Procedures: spontaneous vaginal delivery Postpartum Procedures: none Complications-Operative and Postpartum: none Hemoglobin  Date Value Range Status  10/08/2013 10.7* 12.0 - 15.0 g/dL Final     HCT  Date Value Range Status  10/08/2013 32.1* 36.0 - 46.0 % Final    Physical Exam:  General: alert, cooperative, appears stated age and no distress Lochia: appropriate Uterine Fundus: firm Incision: healing well DVT Evaluation: No evidence of DVT seen on physical exam.  Discharge Diagnoses: Term Pregnancy-delivered  Discharge Information: Date: 10/09/2013 Activity: pelvic rest Diet: routine Medications: tamiflu Condition: stable Instructions: refer to practice specific booklet Discharge to: home   Newborn Data: Live born female  Birth Weight: 7 lb (3175 g) APGAR: 8, 9  Home with baby to stay tonight for billirubin elevation.  Jaclene Bartelt C 10/09/2013, 10:20 AM

## 2013-10-09 NOTE — Lactation Note (Addendum)
This note was copied from the chart of [name]. lLactation Consultation Note  Patient Name: [name] [id number] Date: 10/09/2013 Reason for consult: Follow-up assessment MBU RN Gardiner Coins  Called for a fussy , difficult latch at this feeding.  Per mom baby has been cluster feeding.  LC changed wet diaper, assisted with latch in football position  And worked on depth with mom. Mom has good holding technique  , just needed reminding not to put  pressure on breast tissue in front  Of baby's mouth . Per mom I was afraid she couldn't breath. LC assisted and observed the 24 mins feeding , noted multiply swallows  Increased with breast compressions, mom  Aware , when baby started hanging  Out and being non - nutritive , released suction. Attempted latch the right breast  cross cradle baby not interested. Encouraged skin to skin . Reviewed basics Prior to feeding - breast massage , hand express, reverse pressure due to  Swollen areolas ( semi compress able ) , latch with breast compressions  Until the baby is in a consistent pattern with swallows.     Per mom baby has been cluster feed   Maternal Data Has patient been taught Hand Expression?: Yes (mom able to return demo )  Feeding Feeding Type: Breast Fed Length of feed: 24 min  LATCH Score/Interventions Latch: Grasps breast easily, tongue down, lips flanged, rhythmical sucking. (right football ) Intervention(s): Adjust position;Assist with latch;Breast massage;Breast compression  Audible Swallowing: Spontaneous and intermittent (increased with breast compressions )  Type of Nipple: Everted at rest and after stimulation (areolas - semi compress able , better after Reverse pressure excercise )  Comfort (Breast/Nipple): Soft / non-tender     Hold (Positioning): Assistance needed to correctly position infant at breast and maintain latch. (worked on positioning and depth ) Intervention(s): Breastfeeding  basics reviewed;Support Pillows;Position options;Skin to skin  LATCH Score: 9  Lactation Tools Discussed/Used     Consult Status Consult Status: Follow-up Date: 10/10/13 Follow-up type: In-patient    Kathrin Greathouse 10/09/2013, 11:11 AM

## 2013-12-06 ENCOUNTER — Ambulatory Visit (HOSPITAL_COMMUNITY)
Admission: RE | Admit: 2013-12-06 | Discharge: 2013-12-06 | Disposition: A | Discharge status: Home / Self Care | Payer: No Typology Code available for payment source | Source: Ambulatory Visit | Attending: Obstetrics and Gynecology | Admitting: Obstetrics and Gynecology

## 2013-12-06 NOTE — Lactation Note (Signed)
Adult Lactation Consultation Outpatient Visit Note  Patient Name: [name]     Baby: [name] Date of Birth: 04-21-1986      DOB: [birth year] Gestational Age at Delivery: Unknown    Birth Wt: 7-0 Type of Delivery: NVD       DC Wt: 6-7.2  Breastfeeding History: Frequency of Breastfeeding: Every 2 to 3 hours Length of Feeding: 10 minutes each side Voids: >10 per day Stools: Once a day to every 3 days  Supplementing / Method: None Pumping:  Type of Pump: Medela   Frequency: After feeding, except at night  Volume:  1 to 2 ounces if baby just fed. If baby has not fed, 3 to 5 ounces  Comments:    Consultation Evaluation: Mother here with her mother and baby girl [name] for feeding assessment.  [name] is alert and active with good eye contact and smiles and good tone. Mom states breast feeding is going well, and she has some questions to make sure things are ok before she starts work.  [name] breastfed well for 12 minutes left side, then 10 minutes right side. Mom c/o slight nipple pain with latch. Assisted mom to widen baby's latch for more comfort.  [name] pulls away from the breast at first. Discussed with mom how to slow her let down: to pump off 1 or 2 ounces before the morning feed, and to compress the breast during let down so the milk will flow more slowly. With breast compression, baby settled down into a good rhythm.  Mom started giving bottles of pumped breast milk yesterday in preparation for work. Baby took bottles without difficulty, 3 to 5 ounces.  Mom c/o nipple pain at the end of her 20 minute pumping sessions. Discussed strategies to minimize pain: pump 10 to 15 minutes as long as supply is adequate, lubricate flange with olive oil or coconut oil, decrease pump strength, larger flanges (provided).  Mom encouraged and reassured, questions answered.   Initial Feeding Assessment: Pre-feed Weight: 10-13.9  4930 g Post-feed Weight: 11-0.4  5000  g Amount Transferred: 70+ Comments: After the feeding assessment, baby did latch back on for another 5 minutes. Therefore, baby took more than 70 mL but the exact amount is unknown.  Additional Feeding Assessment: Pre-feed Weight: Post-feed Weight: Amount Transferred: Comments:  Additional Feeding Assessment: Pre-feed Weight: Post-feed Weight: Amount Transferred: Comments:  Total Breast milk Transferred this Visit:  Total Supplement Given:   Additional Interventions:   Follow-Up  PRN    Octavio MannsSanders, Seryna Marek RaLPh [facility] 12/06/2013, 8:49 AM

## 2014-06-09 ENCOUNTER — Ambulatory Visit (HOSPITAL_COMMUNITY): Payer: 59

## 2014-06-09 ENCOUNTER — Other Ambulatory Visit (HOSPITAL_COMMUNITY): Payer: Self-pay | Admitting: Orthopedic Surgery

## 2014-06-09 ENCOUNTER — Ambulatory Visit
Admission: RE | Admit: 2014-06-09 | Discharge: 2014-06-09 | Disposition: A | Discharge status: Home / Self Care | Payer: 59 | Source: Ambulatory Visit | Attending: Orthopedic Surgery | Admitting: Orthopedic Surgery

## 2014-06-09 DIAGNOSIS — W19XXXA Unspecified fall, initial encounter: Secondary | ICD-10-CM

## 2014-08-28 ENCOUNTER — Encounter (HOSPITAL_COMMUNITY): Payer: Self-pay | Admitting: *Deleted

## 2014-09-12 ENCOUNTER — Ambulatory Visit (INDEPENDENT_AMBULATORY_CARE_PROVIDER_SITE_OTHER): Payer: 59 | Admitting: Neurology

## 2014-09-12 ENCOUNTER — Encounter: Payer: Self-pay | Admitting: Neurology

## 2014-09-12 VITALS — BP 122/76 | HR 71 | Ht 66.0 in | Wt 216.6 lb

## 2014-09-12 DIAGNOSIS — G43109 Migraine with aura, not intractable, without status migrainosus: Secondary | ICD-10-CM | POA: Insufficient documentation

## 2014-09-12 HISTORY — DX: Migraine with aura, not intractable, without status migrainosus: G43.109

## 2014-09-12 MED ORDER — AMITRIPTYLINE HCL 10 MG PO TABS: ORAL_TABLET | ORAL | Status: DC

## 2014-09-12 NOTE — Progress Notes (Signed)
Reason for visit: migraine headache  [name] is a 28 y.o. female  History of present illness:  [name] is a 28 year old right-handed white female with a history of classic migraine headache that began when she was 49 and 28 years old. The patient indicates that after that age, she had no headaches until 08/25/2014. The patient has had 5 or 6 headaches since that time. The headaches are associated with visual alteration off to the left associated with a bright light. Subsequent, the patient may have discomfort and pain around the right eye in the frontotemporal region on the right with spread of the headache into the right occipital area and into the right neck. The patient may have some nausea, no vomiting. The patient indicates that she will have to go to bed and sleep, which helps the headache. The headache may only last an hour if she can do this. She indicates that she does have photophobia and phonophobia with the headache. She may feel somewhat spacey during the headache, and she feels wiped out after the headache for up to 3 days with a pounding sensation when she bends or stoops. She denies that there are any particular activating factors for her headache. She is 11 months postpartum, and she is breast-feeding. She indicates that she began to have her menstrual cycle again 6 months after delivery. The patient was given Midrin to take by her OB/GYN doctor. She is not on a daily medication currently. She comes to this office for evaluation of the migraine headaches. The patient has undergone a CT scan of the head that is unremarkable, the disc is brought for my review.  Past Medical History  Diagnosis Date  . Anxiety   . Acid reflux   . Infertility management   . Gestational diabetes   . Classic migraine 09/12/2014    Past Surgical History  Procedure Laterality Date  . Tonsillectomy    . Dilation and curettage of uterus      Family History  Problem Relation Age of  Onset  . Diabetes Mother   . Cancer Mother   . Diabetes Father   . Heart disease Father   . Diabetes Maternal Grandmother   . Migraines Daughter     Social history:  reports that she has never smoked. She has never used smokeless tobacco. She reports that she does not drink alcohol or use illicit drugs.  Medications:  No current outpatient prescriptions on file prior to visit.   No current facility-administered medications on file prior to visit.      Allergies  Allergen Reactions  . Cephalosporins     Had an allergy test and was told was allergic  . Latex Other (See Comments)    Rash, swelling itching    ROS:  Out of a complete 14 system review of symptoms, the patient complains only of the following symptoms, and all other reviewed systems are negative.  Fatigue Eye pain and pressure Headache Anxiety, not enough sleep Shift work  Blood pressure 122/76, pulse 71, height 5\' 6"  (1.676 m), weight 216 lb 9.6 oz (98.249 kg), unknown if currently breastfeeding.  Physical Exam  General: The patient is alert and cooperative at the time of the examination. The patient is moderately obese.  Eyes: Pupils are equal, round, and reactive to light. Discs are flat bilaterally.  Neck: The neck is supple, no carotid bruits are noted.  Respiratory: The respiratory examination is clear.  Cardiovascular: The cardiovascular examination reveals a  regular rate and rhythm, no obvious murmurs or rubs are noted.   Neuromuscular: Range of movement of the cervical spine is full. No crepitus is noted in the temporomandibular joints.  Skin: Extremities are without significant edema.  Neurologic Exam  Mental status: The patient is alert and oriented x 3 at the time of the examination. The patient has apparent normal recent and remote memory, with an apparently normal attention span and concentration ability.  Cranial nerves: Facial symmetry is present. There is good sensation of the face  to pinprick and soft touch bilaterally. The strength of the facial muscles and the muscles to head turning and shoulder shrug are normal bilaterally. Speech is well enunciated, no aphasia or dysarthria is noted. Extraocular movements are full. Visual fields are full. The tongue is midline, and the patient has symmetric elevation of the soft palate. No obvious hearing deficits are noted.  Motor: The motor testing reveals 5 over 5 strength of all 4 extremities. Good symmetric motor tone is noted throughout.  Sensory: Sensory testing is intact to pinprick, soft touch, vibration sensation, and position sense on all 4 extremities. No evidence of extinction is noted.  Coordination: Cerebellar testing reveals good finger-nose-finger and heel-to-shin bilaterally.  Gait and station: Gait is normal. Tandem gait is normal. Romberg is negative. No drift is seen.  Reflexes: Deep tendon reflexes are symmetric and normal bilaterally. Toes are downgoing bilaterally.   Assessment/Plan:  1. Classic migraine headache  The patient is starting to have frequent headaches at this point, etiology for this is not clear. The patient will be placed on amitriptyline in low dose, and the dose will be increased gradually over time. She has Midrin to take if she gets the headache. She is also taking Tylenol. The patient will follow-up in 3-4 months. She will contact me if she is not tolerating the medication, or the medication is ineffective. The patient indicates that she will be breast-feeding only for one more month.  Marlan Palau. Keith Romyn Boswell MD 09/12/2014 8:27 PM  [facility] [address] [place], [place] [id number]  Phone [phone] Fax [phone]

## 2014-09-12 NOTE — Patient Instructions (Signed)

## 2014-12-15 ENCOUNTER — Ambulatory Visit: Payer: 59 | Admitting: Adult Health

## 2015-02-13 ENCOUNTER — Other Ambulatory Visit (INDEPENDENT_AMBULATORY_CARE_PROVIDER_SITE_OTHER): Payer: 59

## 2015-02-13 ENCOUNTER — Encounter: Payer: Self-pay | Admitting: Internal Medicine

## 2015-02-13 ENCOUNTER — Ambulatory Visit (INDEPENDENT_AMBULATORY_CARE_PROVIDER_SITE_OTHER): Payer: 59 | Admitting: Internal Medicine

## 2015-02-13 VITALS — BP 118/78 | HR 81 | Temp 98.3°F | Resp 14 | Ht 65.0 in | Wt 223.4 lb

## 2015-02-13 DIAGNOSIS — Z8632 Personal history of gestational diabetes: Secondary | ICD-10-CM

## 2015-02-13 DIAGNOSIS — M722 Plantar fascial fibromatosis: Secondary | ICD-10-CM

## 2015-02-13 DIAGNOSIS — R1010 Upper abdominal pain, unspecified: Secondary | ICD-10-CM

## 2015-02-13 DIAGNOSIS — R1011 Right upper quadrant pain: Secondary | ICD-10-CM | POA: Diagnosis not present

## 2015-02-13 DIAGNOSIS — E669 Obesity, unspecified: Secondary | ICD-10-CM

## 2015-02-13 LAB — LIPID PANEL
CHOLESTEROL: 206 mg/dL — AB (ref 0–200)
HDL: 53.5 mg/dL (ref >39.00)
LDL Cholesterol: 133 mg/dL — ABNORMAL HIGH (ref 0–99)
NonHDL: 152.5
TRIGLYCERIDES: 100 mg/dL (ref 0.0–149.0)
Total CHOL/HDL Ratio: 4
VLDL: 20 mg/dL (ref 0.0–40.0)

## 2015-02-13 LAB — HEMOGLOBIN A1C: Hgb A1c MFr Bld: 5.3 % (ref 4.6–6.5)

## 2015-02-13 LAB — COMPREHENSIVE METABOLIC PANEL
ALBUMIN: 4.2 g/dL (ref 3.5–5.2)
ALK PHOS: 88 U/L (ref 39–117)
ALT: 16 U/L (ref 0–35)
AST: 14 U/L (ref 0–37)
BUN: 10 mg/dL (ref 6–23)
CO2: 29 meq/L (ref 19–32)
Calcium: 9.2 mg/dL (ref 8.4–10.5)
Chloride: 104 mEq/L (ref 96–112)
Creatinine, Ser: 0.59 mg/dL (ref 0.40–1.20)
GFR: 127.84 mL/min (ref >60.00)
GLUCOSE: 94 mg/dL (ref 70–99)
POTASSIUM: 4.2 meq/L (ref 3.5–5.1)
Sodium: 136 mEq/L (ref 135–145)
Total Bilirubin: 0.8 mg/dL (ref 0.2–1.2)
Total Protein: 6.9 g/dL (ref 6.0–8.3)

## 2015-02-13 MED ORDER — NEXIUM 40 MG PO CPDR: 40.0000 mg | DELAYED_RELEASE_CAPSULE | Freq: Every day | ORAL | Status: DC

## 2015-02-13 NOTE — Progress Notes (Signed)
Pre visit review using our clinic review tool, if applicable. No additional management support is needed unless otherwise documented below in the visit note. 

## 2015-02-13 NOTE — Patient Instructions (Signed)
We will get the ultrasound of the stomach to see if the gallbladder is showing up with problems. If we do not find anything we will do the hida scan.   We will send you to the GI doctor to talk about the reflux and see if they need to do anything else (like a scope looking at the stomach for ulcers or other problems in the esophagus from the acid). Until then we have sent in nexium which is the stronger acid medicine to see if it helps. Give it about 2 weeks to take full effect.   We will have you see the sports medicine doctor for your feet to get those taken care of.   We will check the labs today to make sure everything is okay and the stomach problem is not causing any problems with the kidneys or the liver.   Plantar Fasciitis (Heel Spur Syndrome) with Rehab The plantar fascia is a fibrous, ligament-like, soft-tissue structure that spans the bottom of the foot. Plantar fasciitis is a condition that causes pain in the foot due to inflammation of the tissue. SYMPTOMS   Pain and tenderness on the underneath side of the foot.  Pain that worsens with standing or walking. CAUSES  Plantar fasciitis is caused by irritation and injury to the plantar fascia on the underneath side of the foot. Common mechanisms of injury include:  Direct trauma to bottom of the foot.  Damage to a small nerve that runs under the foot where the main fascia attaches to the heel bone.  Stress placed on the plantar fascia due to bone spurs. RISK INCREASES WITH:   Activities that place stress on the plantar fascia (running, jumping, pivoting, or cutting).  Poor strength and flexibility.  Improperly fitted shoes.  Tight calf muscles.  Flat feet.  Failure to warm-up properly before activity.  Obesity. PREVENTION  Warm up and stretch properly before activity.  Allow for adequate recovery between workouts.  Maintain physical fitness:  Strength, flexibility, and endurance.  Cardiovascular  fitness.  Maintain a health body weight.  Avoid stress on the plantar fascia.  Wear properly fitted shoes, including arch supports for individuals who have flat feet. PROGNOSIS  If treated properly, then the symptoms of plantar fasciitis usually resolve without surgery. However, occasionally surgery is necessary. RELATED COMPLICATIONS   Recurrent symptoms that may result in a chronic condition.  Problems of the lower back that are caused by compensating for the injury, such as limping.  Pain or weakness of the foot during push-off following surgery.  Chronic inflammation, scarring, and partial or complete fascia tear, occurring more often from repeated injections. TREATMENT  Treatment initially involves the use of ice and medication to help reduce pain and inflammation. The use of strengthening and stretching exercises may help reduce pain with activity, especially stretches of the Achilles tendon. These exercises may be performed at home or with a therapist. Your caregiver may recommend that you use heel cups of arch supports to help reduce stress on the plantar fascia. Occasionally, corticosteroid injections are given to reduce inflammation. If symptoms persist for greater than 6 months despite non-surgical (conservative), then surgery may be recommended.  MEDICATION   If pain medication is necessary, then nonsteroidal anti-inflammatory medications, such as aspirin and ibuprofen, or other minor pain relievers, such as acetaminophen, are often recommended.  Do not take pain medication within 7 days before surgery.  Prescription pain relievers may be given if deemed necessary by your caregiver. Use only as directed  and only as much as you need.  Corticosteroid injections may be given by your caregiver. These injections should be reserved for the most serious cases, because they may only be given a certain number of times. HEAT AND COLD  Cold treatment (icing) relieves pain and reduces  inflammation. Cold treatment should be applied for 10 to 15 minutes every 2 to 3 hours for inflammation and pain and immediately after any activity that aggravates your symptoms. Use ice packs or massage the area with a piece of ice (ice massage).  Heat treatment may be used prior to performing the stretching and strengthening activities prescribed by your caregiver, physical therapist, or athletic trainer. Use a heat pack or soak the injury in warm water. SEEK IMMEDIATE MEDICAL CARE IF:  Treatment seems to offer no benefit, or the condition worsens.  Any medications produce adverse side effects. EXERCISES RANGE OF MOTION (ROM) AND STRETCHING EXERCISES - Plantar Fasciitis (Heel Spur Syndrome) These exercises may help you when beginning to rehabilitate your injury. Your symptoms may resolve with or without further involvement from your physician, physical therapist or athletic trainer. While completing these exercises, remember:   Restoring tissue flexibility helps normal motion to return to the joints. This allows healthier, less painful movement and activity.  An effective stretch should be held for at least 30 seconds.  A stretch should never be painful. You should only feel a gentle lengthening or release in the stretched tissue. RANGE OF MOTION - Toe Extension, Flexion  Sit with your right / left leg crossed over your opposite knee.  Grasp your toes and gently pull them back toward the top of your foot. You should feel a stretch on the bottom of your toes and/or foot.  Hold this stretch for __________ seconds.  Now, gently pull your toes toward the bottom of your foot. You should feel a stretch on the top of your toes and or foot.  Hold this stretch for __________ seconds. Repeat __________ times. Complete this stretch __________ times per day.  RANGE OF MOTION - Ankle Dorsiflexion, Active Assisted  Remove shoes and sit on a chair that is preferably not on a carpeted  surface.  Place right / left foot under knee. Extend your opposite leg for support.  Keeping your heel down, slide your right / left foot back toward the chair until you feel a stretch at your ankle or calf. If you do not feel a stretch, slide your bottom forward to the edge of the chair, while still keeping your heel down.  Hold this stretch for __________ seconds. Repeat __________ times. Complete this stretch __________ times per day.  STRETCH - Gastroc, Standing  Place hands on wall.  Extend right / left leg, keeping the front knee somewhat bent.  Slightly point your toes inward on your back foot.  Keeping your right / left heel on the floor and your knee straight, shift your weight toward the wall, not allowing your back to arch.  You should feel a gentle stretch in the right / left calf. Hold this position for __________ seconds. Repeat __________ times. Complete this stretch __________ times per day. STRETCH - Soleus, Standing  Place hands on wall.  Extend right / left leg, keeping the other knee somewhat bent.  Slightly point your toes inward on your back foot.  Keep your right / left heel on the floor, bend your back knee, and slightly shift your weight over the back leg so that you feel a gentle stretch  deep in your back calf.  Hold this position for __________ seconds. Repeat __________ times. Complete this stretch __________ times per day. STRETCH - Gastrocsoleus, Standing  Note: This exercise can place a lot of stress on your foot and ankle. Please complete this exercise only if specifically instructed by your caregiver.   Place the ball of your right / left foot on a step, keeping your other foot firmly on the same step.  Hold on to the wall or a rail for balance.  Slowly lift your other foot, allowing your body weight to press your heel down over the edge of the step.  You should feel a stretch in your right / left calf.  Hold this position for __________  seconds.  Repeat this exercise with a slight bend in your right / left knee. Repeat __________ times. Complete this stretch __________ times per day.  STRENGTHENING EXERCISES - Plantar Fasciitis (Heel Spur Syndrome)  These exercises may help you when beginning to rehabilitate your injury. They may resolve your symptoms with or without further involvement from your physician, physical therapist or athletic trainer. While completing these exercises, remember:   Muscles can gain both the endurance and the strength needed for everyday activities through controlled exercises.  Complete these exercises as instructed by your physician, physical therapist or athletic trainer. Progress the resistance and repetitions only as guided. STRENGTH - Towel Curls  Sit in a chair positioned on a non-carpeted surface.  Place your foot on a towel, keeping your heel on the floor.  Pull the towel toward your heel by only curling your toes. Keep your heel on the floor.  If instructed by your physician, physical therapist or athletic trainer, add ____________________ at the end of the towel. Repeat __________ times. Complete this exercise __________ times per day. STRENGTH - Ankle Inversion  Secure one end of a rubber exercise band/tubing to a fixed object (table, pole). Loop the other end around your foot just before your toes.  Place your fists between your knees. This will focus your strengthening at your ankle.  Slowly, pull your big toe up and in, making sure the band/tubing is positioned to resist the entire motion.  Hold this position for __________ seconds.  Have your muscles resist the band/tubing as it slowly pulls your foot back to the starting position. Repeat __________ times. Complete this exercises __________ times per day.  Document Released: 10/13/2005 Document Revised: 01/05/2012 Document Reviewed: 01/25/2009 [facility] Patient Information 2015 [place], [place]. This information is not  intended to replace advice given to you by your health care provider. Make sure you discuss any questions you have with your health care provider.

## 2015-02-14 ENCOUNTER — Encounter: Payer: Self-pay | Admitting: Physician Assistant

## 2015-02-15 ENCOUNTER — Encounter: Payer: Self-pay | Admitting: Internal Medicine

## 2015-02-15 DIAGNOSIS — E669 Obesity, unspecified: Secondary | ICD-10-CM | POA: Insufficient documentation

## 2015-02-15 DIAGNOSIS — R1010 Upper abdominal pain, unspecified: Secondary | ICD-10-CM | POA: Insufficient documentation

## 2015-02-15 DIAGNOSIS — M722 Plantar fascial fibromatosis: Secondary | ICD-10-CM | POA: Insufficient documentation

## 2015-02-15 NOTE — Assessment & Plan Note (Signed)
Check US abdomen to start as well as labs to rule out kidney or liver problems. Feel that she may also have a component of acid reflux and will start nexium 40 mg daily (she has failed OTC omeprazole). Will refer to GI for the not well controlled acid reflux since she has not gotten relief with adequate therapy.

## 2015-02-15 NOTE — Assessment & Plan Note (Signed)
BP okay, checking lipid panel and HgA1c to look for possible complications.

## 2015-02-15 NOTE — Assessment & Plan Note (Signed)
Given stretching exercises and advised her to see sports medicine for possible steroid injections.

## 2015-02-15 NOTE — Progress Notes (Signed)
   Subjective:    Patient ID: [name] CraneStephanie D Smejkal, female    DOB: 08/30/1986, 29 y.o.   MRN: [id number]  HPI The patient is a 29 YO female who is new and coming in for several acute complaints. She thinks that she has gallbladder problems as she gets RUQ pains with fatty foods which come and are severe for about 10-15 minutes. She does also have heartburn but thinks that this pain is separate. She has taken omeprazole for the stomach pains and this helps for the heart burn but not the other pain. Denies diarrhea or constipation. Denies dark stools or blood in the stool. The other problem is her feet are hurting and they hurt worse with usage and long periods of standing. She has had it for 6 months or so, pain 6/10 most of the time. Has tried tylenol and ibuprofen with only minimal relief. Goes away with rest but returns with activity.   Review of Systems  Constitutional: Positive for activity change. Negative for fever, appetite change and fatigue.  HENT: Negative.   Eyes: Negative.   Respiratory: Negative for cough, chest tightness, shortness of breath and wheezing.   Cardiovascular: Negative for chest pain, palpitations and leg swelling.  Gastrointestinal: Positive for abdominal pain. Negative for nausea, diarrhea, constipation, blood in stool and abdominal distention.  Musculoskeletal: Positive for myalgias and arthralgias. Negative for gait problem.  Skin: Negative.   Neurological: Negative.   Psychiatric/Behavioral: Negative.       Objective:   Physical Exam  Constitutional: She is oriented to person, place, and time. She appears well-developed and well-nourished.  Overweight  HENT:  Head: Normocephalic and atraumatic.  Eyes: EOM are normal.  Neck: Normal range of motion.  Cardiovascular: Normal rate and regular rhythm.   Pulmonary/Chest: Effort normal and breath sounds normal. No respiratory distress. She has no wheezes.  Abdominal: Soft. Bowel sounds are normal. She exhibits no  distension. There is tenderness. There is no rebound.  Tenderness in the upper stomach, no guarding or rebound  Musculoskeletal: She exhibits no edema.  Neurological: She is alert and oriented to person, place, and time. Coordination normal.  Skin: Skin is warm and dry.   Filed Vitals:   02/13/15 1010  BP: 118/78  Pulse: 81  Temp: 98.3 F (36.8 C)  TempSrc: Oral  Resp: 14  Height: 5\' 5"  (1.651 m)  Weight: 223 lb 6.4 oz (101.334 kg)  SpO2: 97%      Assessment & Plan:

## 2015-02-16 ENCOUNTER — Ambulatory Visit: Payer: 59 | Admitting: Internal Medicine

## 2015-02-19 ENCOUNTER — Other Ambulatory Visit: Payer: 59

## 2015-02-21 ENCOUNTER — Ambulatory Visit
Admission: RE | Admit: 2015-02-21 | Discharge: 2015-02-21 | Disposition: A | Discharge status: Home / Self Care | Payer: 59 | Source: Ambulatory Visit | Attending: Internal Medicine | Admitting: Internal Medicine

## 2015-02-21 DIAGNOSIS — R1011 Right upper quadrant pain: Secondary | ICD-10-CM

## 2015-02-26 ENCOUNTER — Ambulatory Visit: Payer: 59 | Admitting: Family Medicine

## 2015-03-02 ENCOUNTER — Encounter: Payer: Self-pay | Admitting: Gastroenterology

## 2015-03-02 ENCOUNTER — Encounter: Payer: Self-pay | Admitting: Physician Assistant

## 2015-03-02 ENCOUNTER — Ambulatory Visit (INDEPENDENT_AMBULATORY_CARE_PROVIDER_SITE_OTHER): Payer: 59 | Admitting: Physician Assistant

## 2015-03-02 VITALS — BP 106/70 | HR 84 | Ht 64.75 in | Wt 223.5 lb

## 2015-03-02 DIAGNOSIS — K219 Gastro-esophageal reflux disease without esophagitis: Secondary | ICD-10-CM | POA: Diagnosis not present

## 2015-03-02 DIAGNOSIS — R1013 Epigastric pain: Secondary | ICD-10-CM | POA: Diagnosis not present

## 2015-03-02 MED ORDER — FAMOTIDINE 40 MG PO TABS: 40.0000 mg | ORAL_TABLET | Freq: Two times a day (BID) | ORAL | Status: DC

## 2015-03-02 NOTE — Patient Instructions (Addendum)
Please go to the basement level to our lab for a urine test.  Take Pepcid 40 mg twice daily. We sent a prescription to [facility].  You have been scheduled for an endoscopy. Please follow written instructions given to you at your visit today. If you use inhalers (even only as needed), please bring them with you on the day of your procedure.  You have been scheduled for a HIDA scan at [facility] (1st floor) on 03-21-2015. Please arrive 15 minutes prior to your scheduled appointment at  9:30 am . Make certain not to have anything to eat or drink at least 6 hours prior to your test. Should this appointment date or time not work well for you, please call radiology scheduling at [phone]. After the injection, you will need to pump and dump for 24 to 48 hours.  _____________________________________________________________________ hepatobiliary (HIDA) scan is an imaging procedure used to diagnose problems in the liver, gallbladder and bile ducts. In the HIDA scan, a radioactive chemical or tracer is injected into a vein in your arm. The tracer is handled by the liver like bile. Bile is a fluid produced and excreted by your liver that helps your digestive system break down fats in the foods you eat. Bile is stored in your gallbladder and the gallbladder releases the bile when you eat a meal. A special nuclear medicine scanner (gamma camera) tracks the flow of the tracer from your liver into your gallbladder and small intestine.  During your HIDA scan  You'll be asked to change into a hospital gown before your HIDA scan begins. Your health care team will position you on a table, usually on your back. The radioactive tracer is then injected into a vein in your arm.The tracer travels through your bloodstream to your liver, where it's taken up by the bile-producing cells. The radioactive tracer travels with the bile from your liver into your gallbladder and through your bile ducts  to your small intestine.You may feel some pressure while the radioactive tracer is injected into your vein. As you lie on the table, a special gamma camera is positioned over your abdomen taking pictures of the tracer as it moves through your body. The gamma camera takes pictures continually for about an hour. You'll need to keep still during the HIDA scan. This can become uncomfortable, but you may find that you can lessen the discomfort by taking deep breaths and thinking about other things. Tell your health care team if you're uncomfortable. The radiologist will watch on a computer the progress of the radioactive tracer through your body. The HIDA scan may be stopped when the radioactive tracer is seen in the gallbladder and enters your small intestine. This typically takes about an hour. In some cases extra imaging will be performed if original images aren't satisfactory, if morphine is given to help visualize the gallbladder or if the medication CCK is given to look at the contraction of the gallbladder. This test typically takes 2 hours to complete. ________________________________________________________________________

## 2015-03-02 NOTE — Progress Notes (Signed)
Patient ID: [name], female   DOB: 03-01-1986, 29 y.o.   MRN: [id number]   Subjective:    Patient ID: [name], female    DOB: 03-01-1986, 29 y.o.   MRN: [id number]  HPI [name] is a pleasant 29 year old white female new to GI today referred by Dr Dorise HissKollar for complaints of epigastric pain and reflux symptoms. Patient says she has had a long history of acid reflux symptoms dating back to her teenage years. She believes that she had a barium swallow many years ago and has taken medications off and on for years. Still nursing her 9860-month-old daughter and is trying to avoid medications because of nursing. She says she has had a difficult time over the past year with heartburn indigestion occasional dysphagia and episodes of epigastric pain which at times are intense. She had an episode recently that radiated around her right side and into her back. Says she has had these several times within the past few months. She was given a trial of Nexium but says she is only taking it twice again because she is reluctant due to the nursing. He is worried about her gallbladder. She had upper abdominal ultrasound done which was negative and recent labs were unremarkable.  Review of Systems  Pertinent positive and negative review of systems were noted in the above HPI section.  All other review of systems was otherwise negative.  Outpatient Encounter Prescriptions as of 03/02/2015  Medication Sig  . acetaminophen (TYLENOL) 325 MG tablet Take 325 mg by mouth as needed.  Marland Kitchen. NEXIUM 40 MG capsule Take 1 capsule (40 mg total) by mouth daily. (Patient taking differently: Take 40 mg by mouth as needed. )  . famotidine (PEPCID) 40 MG tablet Take 1 tablet (40 mg total) by mouth 2 (two) times daily.  . [DISCONTINUED] amitriptyline (ELAVIL) 10 MG tablet Take one tablet at night for one week, then take 2 tablets at night for one week, then take 3 tablets at night. (Patient not taking: Reported on 02/13/2015)  .  [DISCONTINUED] isometheptene-acetaminophen-dichloralphenazone (MIDRIN) 65-325-100 MG capsule Take by mouth 4 (four) times daily as needed for migraine (patient taking as needed). Maximum 5 capsules in 12 hours for migraine headaches, 8 capsules in 24 hours for tension headaches.   No facility-administered encounter medications on file as of 03/02/2015.   Allergies  Allergen Reactions  . Cephalosporins     Had an allergy test and was told was allergic  . Latex Other (See Comments)    Rash, swelling itching   Patient Active Problem List   Diagnosis Date Noted  . Pain of upper abdomen 02/15/2015  . Plantar fasciitis 02/15/2015  . Obesity 02/15/2015  . Classic migraine 09/12/2014   History   Social History  . Marital Status: Married    Spouse Name: N/A  . Number of Children: 2  . Years of Education: some colle   Occupational History  . rad tech    Social History Main Topics  . Smoking status: Never Smoker   . Smokeless tobacco: Never Used  . Alcohol Use: No  . Drug Use: No  . Sexual Activity: Not on file   Other Topics Concern  . Not on file   Social History Narrative   Patient lives at home with husband with 2 daughters.    Ms. [name]'s family history includes Cervical cancer in her mother; Colon polyps in her mother; Diabetes in her father, maternal grandmother, and mother; Heart disease in her father; Kidney  disease in her maternal grandmother; Liver disease in her father; Migraines in her daughter.      Objective:    Filed Vitals:   03/02/15 0835  BP: 106/70  Pulse: 84    Physical Exam    well-developed young white female in no acute distress, pleasant blood pressure 106/70 pulse 84 height 5 foot 4 weight 12/20/1935. HEENT ;nontraumatic normocephalic EOMI PERRLA sclera anicteric, Supple; no JVD, Cardiovascular ;regular rate and rhythm with S1-S2 no murmur or gallop, Pulmonary ;clear bilaterally, Abdomen; soft minimally tender in the epigastrium there is no  guarding or rebound no palpable mass or hepatosplenomegaly bowel sounds are present, Rectal; exam not done, Extremities ;no clubbing cyanosis or edema skin warm and dry, Psych ;mood and affect appropriate       Assessment & Plan:   #1 29 yo female with chronic GERD, worse x 1 year, intermittent  Dysphagia- r/o stricture #2 episodes of epigastric pain-US neg- r/o biliary dyskinesia, gastropathy/Pud #3 obesity  Plan schedule for CCK HIDA  schedule for EGD with Dr Russella DarStark- procedure discussed in detail and pt agreeable to proceed. Antireflux regimen  Start Pepcid 40 mg BID instead of Nexium.- pt working on weaning from nursing , and aware she should pump and dump milk for at least 24 hours after procedures  Amy Oswald HillockS Esterwood PA-C 03/02/2015   Cc: [name], Elizabeth A, MD

## 2015-03-02 NOTE — Progress Notes (Signed)
Reviewed and agree with management plan.  Dannae Kato T. Evangelina Delancey, MD FACG 

## 2015-03-05 ENCOUNTER — Encounter: Payer: 59 | Admitting: Gastroenterology

## 2015-03-07 ENCOUNTER — Encounter: Payer: 59 | Admitting: Gastroenterology

## 2015-03-21 ENCOUNTER — Other Ambulatory Visit (HOSPITAL_COMMUNITY): Payer: 59

## 2015-04-09 ENCOUNTER — Ambulatory Visit (HOSPITAL_COMMUNITY): Payer: 59

## 2015-05-15 ENCOUNTER — Ambulatory Visit (AMBULATORY_SURGERY_CENTER): Payer: 59 | Admitting: Gastroenterology

## 2015-05-15 ENCOUNTER — Encounter: Payer: Self-pay | Admitting: Gastroenterology

## 2015-05-15 VITALS — BP 116/65 | HR 77 | Temp 97.8°F | Resp 26 | Ht 64.0 in | Wt 223.0 lb

## 2015-05-15 DIAGNOSIS — R1314 Dysphagia, pharyngoesophageal phase: Secondary | ICD-10-CM | POA: Diagnosis not present

## 2015-05-15 DIAGNOSIS — K219 Gastro-esophageal reflux disease without esophagitis: Secondary | ICD-10-CM

## 2015-05-15 DIAGNOSIS — K2950 Unspecified chronic gastritis without bleeding: Secondary | ICD-10-CM | POA: Diagnosis not present

## 2015-05-15 DIAGNOSIS — R1013 Epigastric pain: Secondary | ICD-10-CM | POA: Diagnosis not present

## 2015-05-15 MED ORDER — SODIUM CHLORIDE 0.9 % IV SOLN: 500.0000 mL | INTRAVENOUS | Status: DC

## 2015-05-15 NOTE — Progress Notes (Signed)
A/ox3 pleased with MAC, report to Suzanne RN 

## 2015-05-15 NOTE — Patient Instructions (Signed)
YOU HAD AN ENDOSCOPIC PROCEDURE TODAY AT THE [facility]:   Refer to the procedure report that was given to you for any specific questions about what was found during the examination.  If the procedure report does not answer your questions, please call your gastroenterologist to clarify.  If you requested that your care partner not be given the details of your procedure findings, then the procedure report has been included in a sealed envelope for you to review at your convenience later.  YOU SHOULD EXPECT: Some feelings of bloating in the abdomen. Passage of more gas than usual.  Walking can help get rid of the air that was put into your GI tract during the procedure and reduce the bloating. If you had a lower endoscopy (such as a colonoscopy or flexible sigmoidoscopy) you may notice spotting of blood in your stool or on the toilet paper. If you underwent a bowel prep for your procedure, you may not have a normal bowel movement for a few days.  Please Note:  You might notice some irritation and congestion in your nose or some drainage.  This is from the oxygen used during your procedure.  There is no need for concern and it should clear up in a day or so.  SYMPTOMS TO REPORT IMMEDIATELY:    Following upper endoscopy (EGD)  Vomiting of blood or coffee ground material  New chest pain or pain under the shoulder blades  Painful or persistently difficult swallowing  New shortness of breath  Fever of 100F or higher  Black, tarry-looking stools  For urgent or emergent issues, a gastroenterologist can be reached at any hour by calling [phone].   DIET: Your first meal following the procedure should be a small meal and then it is ok to progress to your normal diet. Heavy or fried foods are harder to digest and may make you feel nauseous or bloated.  Likewise, meals heavy in dairy and vegetables can increase bloating.  Drink plenty of fluids but you should avoid alcoholic beverages  for 24 hours. Try to follow the GERD diet.  ACTIVITY:  You should plan to take it easy for the rest of today and you should NOT DRIVE or use heavy machinery until tomorrow (because of the sedation medicines used during the test).    FOLLOW UP: Our staff will call the number listed on your records the next business day following your procedure to check on you and address any questions or concerns that you may have regarding the information given to you following your procedure. If we do not reach you, we will leave a message.  However, if you are feeling well and you are not experiencing any problems, there is no need to return our call.  We will assume that you have returned to your regular daily activities without incident.  If any biopsies were taken you will be contacted by phone or by letter within the next 1-3 weeks.  Please call us at [phone] if you have not heard about the biopsies in 3 weeks.    SIGNATURES/CONFIDENTIALITY: You and/or your care partner have signed paperwork which will be entered into your electronic medical record.  These signatures attest to the fact that that the information above on your After Visit Summary has been reviewed and is understood.  Full responsibility of the confidentiality of this discharge information lies with you and/or your care-partner.

## 2015-05-15 NOTE — Progress Notes (Signed)
Called to room to assist during endoscopic procedure.  Patient ID and intended procedure confirmed with present staff. Received instructions for my participation in the procedure from the performing physician.  

## 2015-05-15 NOTE — Op Note (Signed)
[facility] [address] [place], [id number]   ENDOSCOPY PROCEDURE REPORT  PATIENT: [name], [name]  MR#: [id number] BIRTHDATE: 09-Nov-1985 , 29  yrs. old GENDER: female ENDOSCOPIST: Meryl DareMalcolm T Kooper Godshall, MD, [facility] REFERRED BY:  Genella MechElizabeth Kollar, M.Chambers. PROCEDURE DATE:  05/15/2015 PROCEDURE:  EGD w/ biopsy ASA CLASS:     Class II INDICATIONS:  history of esophageal reflux, epigastric pain, and dysphagia. MEDICATIONS: Monitored anesthesia care and Propofol 250 mg IV TOPICAL ANESTHETIC: none DESCRIPTION OF PROCEDURE: After the risks benefits and alternatives of the procedure were thoroughly explained, informed consent was obtained.  The LB [id number] endoscope was introduced through the mouth and advanced to the second portion of the duodenum , Without limitations.  The instrument was slowly withdrawn as the mucosa was fully examined.    STOMACH: Mild erosive gastritis was found in the gastric antrum and mild nonerosive gastritis in the gastric body.  Multiple biopsies were performed.   The stomach otherwise appeared normal. ESOPHAGUS: The mucosa of the esophagus appeared normal. DUODENUM: The duodenal mucosa showed no abnormalities in the bulb and 2nd part of the duodenum.  Retroflexed views revealed no abnormalities.     The scope was then withdrawn from the patient and the procedure completed.  COMPLICATIONS: There were no immediate complications.  ENDOSCOPIC IMPRESSION: 1.   Erosive gastritis; multiple biopsies performed 2.   The EGD otherwise appeared normal  RECOMMENDATIONS: 1.  Await pathology results 2.  Anti-reflux regimen long term 3.  Continue Pepcid BID and can change to PPI in future if Pepcid not adequate  eSigned:  Meryl DareMalcolm T Charley Miske, MD, [facility] 05/15/2015 8:21 AM

## 2015-05-16 ENCOUNTER — Telehealth: Payer: Self-pay | Admitting: *Deleted

## 2015-05-16 NOTE — Telephone Encounter (Signed)
  Follow up Call-  Call back number 05/15/2015  Post procedure Call Back phone  # [phone]  Permission to leave phone message Yes     Patient questions:  Do you have a fever, pain , or abdominal swelling? No. Pain Score  0 *  Have you tolerated food without any problems? Yes.    Have you been able to return to your normal activities? Yes.    Do you have any questions about your discharge instructions: Diet   No. Medications  No. Follow up visit  No.  Do you have questions or concerns about your Care? Yes.    Actions: * If pain score is 4 or above: No action needed, pain <4.  Patient c/o about the way the recovery nurse took off her Tegaderm, it left a bruise at that site not at the IV site per patient. Will notify our manager, Dorene GrebeNatalie.

## 2015-05-21 ENCOUNTER — Encounter: Payer: Self-pay | Admitting: Gastroenterology

## 2015-06-24 ENCOUNTER — Ambulatory Visit (INDEPENDENT_AMBULATORY_CARE_PROVIDER_SITE_OTHER): Payer: 59 | Admitting: Emergency Medicine

## 2015-06-24 ENCOUNTER — Encounter: Payer: Self-pay | Admitting: *Deleted

## 2015-06-24 VITALS — BP 110/78 | HR 96 | Temp 97.6°F | Resp 16 | Ht 65.5 in | Wt 225.5 lb

## 2015-06-24 DIAGNOSIS — R062 Wheezing: Secondary | ICD-10-CM

## 2015-06-24 MED ORDER — ALBUTEROL SULFATE HFA 108 (90 BASE) MCG/ACT IN AERS: 2.0000 | INHALATION_SPRAY | RESPIRATORY_TRACT | Status: DC | PRN

## 2015-06-24 MED ORDER — IPRATROPIUM BROMIDE 0.02 % IN SOLN: 0.5000 mg | Freq: Once | RESPIRATORY_TRACT | Status: DC

## 2015-06-24 MED ORDER — ALBUTEROL SULFATE (2.5 MG/3ML) 0.083% IN NEBU
2.5000 mg | INHALATION_SOLUTION | Freq: Once | RESPIRATORY_TRACT | Status: AC
  Administered 2015-06-24: 2.5 mg via RESPIRATORY_TRACT

## 2015-06-24 MED ORDER — AZITHROMYCIN 250 MG PO TABS: ORAL_TABLET | ORAL | Status: DC

## 2015-06-24 NOTE — Progress Notes (Addendum)
This chart was scribed for Lesle Chris, MD by Broadus John, Medical Scribe. This patient was seen in Room 1 and the patient's care was started at 2:56 PM.   Chief Complaint:  Chief Complaint  Patient presents with  . Cough    x 1 week  . Nasal Congestion    HPI: [name] is a 29 y.o. female who reports to [facility] today complaining of yellow-green productive cough, onset one week ago.  Pt notes having associated symptoms of nasal congestion, chest pain on the beginning of onset. She indicates that laughing causes mild episodes of wheezing. Pt reports that both of her kids who had similar symptoms around the same time as her onset were diagnosed of walking PNA today,  in addition to her husband presenting with similar symptoms as well. She states that she took Allegra for the symptoms, however she found no relief. Pt denies sore throat, current fever.   Pt reports that she is not a smoker, and she is currently nursing a 32 month old daughter.    Past Medical History  Diagnosis Date  . Anxiety   . Acid reflux   . Infertility management   . Gestational diabetes   . Classic migraine 09/12/2014   Past Surgical History  Procedure Laterality Date  . Tonsillectomy    . Dilation and curettage of uterus     Social History   Social History  . Marital Status: Married    Spouse Name: N/A  . Number of Children: 2  . Years of Education: some colle   Occupational History  . rad tech    Social History Main Topics  . Smoking status: Never Smoker   . Smokeless tobacco: Never Used  . Alcohol Use: No  . Drug Use: No  . Sexual Activity: Not Asked   Other Topics Concern  . None   Social History Narrative   Patient lives at home with husband with 2 daughters.   Family History  Problem Relation Age of Onset  . Diabetes Mother   . Cervical cancer Mother   . Diabetes Father   . Heart disease Father   . Diabetes Maternal Grandmother   . Migraines Daughter   . Colon  polyps Mother   . Liver disease Father   . Kidney disease Maternal Grandmother    Allergies  Allergen Reactions  . Cephalosporins     Had an allergy test and was told was allergic  . Latex Other (See Comments)    Rash, swelling itching   Prior to Admission medications   Medication Sig Start Date End Date Taking? Authorizing Provider  acetaminophen (TYLENOL) 325 MG tablet Take 325 mg by mouth as needed.   Yes Historical Provider, MD  famotidine (PEPCID) 40 MG tablet Take 1 tablet (40 mg total) by mouth 2 (two) times daily. 03/02/15  Yes Amy S Esterwood, PA-C     ROS: The patient has cough, nasal congestion, wheezing.  sore throat, fever.  All other systems have been reviewed and were otherwise negative with the exception of those mentioned in the HPI and as above.    PHYSICAL EXAM: Filed Vitals:   06/24/15 1442  BP: 110/78  Pulse: 96  Temp: 97.6 F (36.4 C)   Body mass index is 36.94 kg/(m^2).   General: Alert, no acute distress HEENT:  Nasal congestion. Normocephalic, atraumatic, oropharynx patent. Eye: [facility] Cardiovascular:  Regular rate and rhythm, no rubs murmurs or gallops.  No Carotid bruits,  radial pulse intact. No pedal edema.  Respiratory: Mild wheezing on expiration. No rales, or rhonchi.  No cyanosis, no use of accessory musculature Abdominal: No organomegaly, abdomen is soft and non-tender, positive bowel sounds.  No masses. Musculoskeletal: Gait intact. No edema, tenderness Skin: No rashes. Neurologic: Facial musculature symmetric. Psychiatric: Patient acts appropriately throughout our interaction. Lymphatic: No cervical or submandibular lymphadenopathy Genitourinary/Anorectal: No acute findings  Meds ordered this encounter  Medications  . albuterol (PROVENTIL) (2.5 MG/3ML) 0.083% nebulizer solution 2.5 mg    Sig:   . DISCONTD: ipratropium (ATROVENT) nebulizer solution 0.5 mg    Sig:   . azithromycin (ZITHROMAX) 250 MG tablet    Sig: Take 2  today then one a day for 4 days    Dispense:  6 tablet    Refill:  0  . albuterol (PROVENTIL HFA;VENTOLIN HFA) 108 (90 BASE) MCG/ACT inhaler    Sig: Inhale 2 puffs into the lungs every 4 (four) hours as needed for wheezing or shortness of breath (cough, shortness of breath or wheezing.).    Dispense:  1 Inhaler    Refill:  1    LABS:    EKG/XRAY:   Primary read interpreted by Dr. Cleta Alberts at [facility].   ASSESSMENT/PLAN: Patient presents with upper respiratory infection with bronchitis. Everyone at home is sick. The child was diagnosed with pneumonia today. Husband is also sick. She has had a productive cough. She is breast-feeding. Will treat with Zithromax and albuterol. She received treatment in the office and this did help with her symptoms so we will also add the inhaler.   Gross sideeffects, risk and benefits, and alternatives of medications d/w patient. Patient is aware that all medications have potential sideeffects and we are unable to predict every sideeffect or drug-drug interaction that may occur.  Lesle Chris MD 06/24/2015 2:55 PM

## 2015-06-24 NOTE — Patient Instructions (Signed)
Acute Bronchitis °Bronchitis is inflammation of the airways that extend from the windpipe into the lungs (bronchi). The inflammation often causes mucus to develop. This leads to a cough, which is the most common symptom of bronchitis.  °In acute bronchitis, the condition usually develops suddenly and goes away over time, usually in a couple weeks. Smoking, allergies, and asthma can make bronchitis worse. Repeated episodes of bronchitis may cause further lung problems.  °CAUSES °Acute bronchitis is most often caused by the same virus that causes a cold. The virus can spread from person to person (contagious) through coughing, sneezing, and touching contaminated objects. °SIGNS AND SYMPTOMS  °· Cough.   °· Fever.   °· Coughing up mucus.   °· Body aches.   °· Chest congestion.   °· Chills.   °· Shortness of breath.   °· Sore throat.   °DIAGNOSIS  °Acute bronchitis is usually diagnosed through a physical exam. Your health care provider will also ask you questions about your medical history. Tests, such as chest X-rays, are sometimes done to rule out other conditions.  °TREATMENT  °Acute bronchitis usually goes away in a couple weeks. Oftentimes, no medical treatment is necessary. Medicines are sometimes given for relief of fever or cough. Antibiotic medicines are usually not needed but may be prescribed in certain situations. In some cases, an inhaler may be recommended to help reduce shortness of breath and control the cough. A cool mist vaporizer may also be used to help thin bronchial secretions and make it easier to clear the chest.  °HOME CARE INSTRUCTIONS °· Get plenty of rest.   °· Drink enough fluids to keep your urine clear or pale yellow (unless you have a medical condition that requires fluid restriction). Increasing fluids may help thin your respiratory secretions (sputum) and reduce chest congestion, and it will prevent dehydration.   °· Take medicines only as directed by your health care provider. °· If  you were prescribed an antibiotic medicine, finish it all even if you start to feel better. °· Avoid smoking and secondhand smoke. Exposure to cigarette smoke or irritating chemicals will make bronchitis worse. If you are a smoker, consider using nicotine gum or skin patches to help control withdrawal symptoms. Quitting smoking will help your lungs heal faster.   °· Reduce the chances of another bout of acute bronchitis by washing your hands frequently, avoiding people with cold symptoms, and trying not to touch your hands to your mouth, nose, or eyes.   °· Keep all follow-up visits as directed by your health care provider.   °SEEK MEDICAL CARE IF: °Your symptoms do not improve after 1 week of treatment.  °SEEK IMMEDIATE MEDICAL CARE IF: °· You develop an increased fever or chills.   °· You have chest pain.   °· You have severe shortness of breath. °· You have bloody sputum.   °· You develop dehydration. °· You faint or repeatedly feel like you are going to pass out. °· You develop repeated vomiting. °· You develop a severe headache. °MAKE SURE YOU:  °· Understand these instructions. °· Will watch your condition. °· Will get help right away if you are not doing well or get worse. °Document Released: 11/20/2004 Document Revised: 02/27/2014 Document Reviewed: 04/05/2013 °ExitCare® Patient Information ©2015 [employer]. This information is not intended to replace advice given to you by your health care provider. Make sure you discuss any questions you have with your health care provider. °Bronchospasm °A bronchospasm is when the tubes that carry air in and out of your lungs (airways) spasm or tighten. During a bronchospasm it   is hard to breathe. This is because the airways get smaller. A bronchospasm can be triggered by: °· Allergies. These may be to animals, pollen, food, or mold. °· Infection. This is a common cause of bronchospasm. °· Exercise. °· Irritants. These include pollution, cigarette smoke, strong odors,  aerosol sprays, and paint fumes. °· Weather changes. °· Stress. °· Being emotional. °HOME CARE  °· Always have a plan for getting help. Know when to call your doctor and local emergency services (911 in the [place] Know where you can get emergency care. °· Only take medicines as told by your doctor. °· If you were prescribed an inhaler or nebulizer machine, ask your doctor how to use it correctly. Always use a spacer with your inhaler if you were given one. °· Stay calm during an attack. Try to relax and breathe more slowly. °· Control your home environment: °¨ Change your heating and air conditioning filter at least once a month. °¨ Limit your use of fireplaces and wood stoves. °¨ Do not  smoke. Do not  allow smoking in your home. °¨ Avoid perfumes and fragrances. °¨ Get rid of pests (such as roaches and mice) and their droppings. °¨ Throw away plants if you see mold on them. °¨ Keep your house clean and dust free. °¨ Replace carpet with wood, tile, or vinyl flooring. Carpet can trap dander and dust. °¨ Use allergy-proof pillows, mattress covers, and box spring covers. °¨ Wash bed sheets and blankets every week in hot water. Dry them in a dryer. °¨ Use blankets that are made of polyester or cotton. °¨ Wash hands frequently. °GET HELP IF: °· You have muscle aches. °· You have chest pain. °· The thick spit you spit or cough up (sputum) changes from clear or white to yellow, green, gray, or bloody. °· The thick spit you spit or cough up gets thicker. °· There are problems that may be related to the medicine you are given such as: °¨ A rash. °¨ Itching. °¨ Swelling. °¨ Trouble breathing. °GET HELP RIGHT AWAY IF: °· You feel you cannot breathe or catch your breath. °· You cannot stop coughing. °· Your treatment is not helping you breathe better. °· You have very bad chest pain. °MAKE SURE YOU:  °· Understand these instructions. °· Will watch your condition. °· Will get help right away if you are not doing well or get  worse. °Document Released: 08/10/2009 Document Revised: 10/18/2013 Document Reviewed: 04/05/2013 °ExitCare® Patient Information ©2015 [employer]. This information is not intended to replace advice given to you by your health care provider. Make sure you discuss any questions you have with your health care provider. ° °

## 2015-06-26 ENCOUNTER — Ambulatory Visit
Admission: RE | Admit: 2015-06-26 | Discharge: 2015-06-26 | Disposition: A | Discharge status: Home / Self Care | Payer: 59 | Source: Ambulatory Visit | Attending: Obstetrics and Gynecology | Admitting: Obstetrics and Gynecology

## 2015-06-26 ENCOUNTER — Other Ambulatory Visit: Payer: Self-pay | Admitting: Obstetrics and Gynecology

## 2015-06-26 DIAGNOSIS — N63 Unspecified lump in unspecified breast: Secondary | ICD-10-CM

## 2015-07-19 ENCOUNTER — Inpatient Hospital Stay (HOSPITAL_COMMUNITY): Payer: 59

## 2015-07-19 ENCOUNTER — Inpatient Hospital Stay (HOSPITAL_COMMUNITY)
Admission: AD | Admit: 2015-07-19 | Discharge: 2015-07-20 | Disposition: A | Discharge status: Home / Self Care | Payer: 59 | Source: Ambulatory Visit | Attending: Obstetrics and Gynecology | Admitting: Obstetrics and Gynecology

## 2015-07-19 ENCOUNTER — Encounter (HOSPITAL_COMMUNITY): Payer: Self-pay | Admitting: *Deleted

## 2015-07-19 DIAGNOSIS — Z3A01 Less than 8 weeks gestation of pregnancy: Secondary | ICD-10-CM | POA: Diagnosis not present

## 2015-07-19 DIAGNOSIS — O26891 Other specified pregnancy related conditions, first trimester: Secondary | ICD-10-CM | POA: Insufficient documentation

## 2015-07-19 DIAGNOSIS — R197 Diarrhea, unspecified: Secondary | ICD-10-CM | POA: Diagnosis present

## 2015-07-19 DIAGNOSIS — R102 Pelvic and perineal pain: Secondary | ICD-10-CM | POA: Diagnosis not present

## 2015-07-19 DIAGNOSIS — O219 Vomiting of pregnancy, unspecified: Secondary | ICD-10-CM

## 2015-07-19 DIAGNOSIS — O210 Mild hyperemesis gravidarum: Secondary | ICD-10-CM | POA: Insufficient documentation

## 2015-07-19 LAB — URINALYSIS, ROUTINE W REFLEX MICROSCOPIC
BILIRUBIN URINE: NEGATIVE
Glucose, UA: NEGATIVE mg/dL
Ketones, ur: 15 mg/dL — AB
Leukocytes, UA: NEGATIVE
NITRITE: NEGATIVE
PH: 5.5 (ref 5.0–8.0)
Protein, ur: NEGATIVE mg/dL
Urobilinogen, UA: 0.2 mg/dL (ref 0.0–1.0)

## 2015-07-19 LAB — URINE MICROSCOPIC-ADD ON

## 2015-07-19 LAB — BASIC METABOLIC PANEL
Anion gap: 5 (ref 5–15)
BUN: 13 mg/dL (ref 6–20)
CHLORIDE: 108 mmol/L (ref 101–111)
CO2: 23 mmol/L (ref 22–32)
CREATININE: 0.62 mg/dL (ref 0.44–1.00)
Calcium: 9.5 mg/dL (ref 8.9–10.3)
GFR calc Af Amer: >60 mL/min (ref >60)
GFR calc non Af Amer: >60 mL/min (ref >60)
Glucose, Bld: 98 mg/dL (ref 65–99)
Potassium: 3.7 mmol/L (ref 3.5–5.1)
SODIUM: 136 mmol/L (ref 135–145)

## 2015-07-19 LAB — POCT PREGNANCY, URINE: PREG TEST UR: POSITIVE — AB

## 2015-07-19 MED ORDER — LACTATED RINGERS IV BOLUS (SEPSIS)
1000.0000 mL | Freq: Once | INTRAVENOUS | Status: AC
  Administered 2015-07-19: 1000 mL via INTRAVENOUS

## 2015-07-19 MED ORDER — LACTATED RINGERS IV SOLN
INTRAVENOUS | Status: DC
  Administered 2015-07-19: 23:00:00 via INTRAVENOUS

## 2015-07-19 MED ORDER — PROMETHAZINE HCL 25 MG/ML IJ SOLN
12.5000 mg | Freq: Once | INTRAMUSCULAR | Status: AC
  Administered 2015-07-19: 12.5 mg via INTRAVENOUS
  Filled 2015-07-19: qty 1

## 2015-07-19 NOTE — MAU Provider Note (Signed)
CSN: [id number]     Arrival date & time 07/19/15  1905 History   None    No chief complaint on file.    (Consider location/radiation/quality/duration/timing/severity/associated sxs/prior Treatment) Patient is a 29 y.o. female presenting with diarrhea. The history is provided by the patient. No language interpreter was used.  Diarrhea The primary symptoms include nausea, diarrhea and myalgias. Primary symptoms do not include fever, abdominal pain or dysuria. The illness began yesterday. The onset was gradual. The problem has been gradually worsening.  The myalgias are associated with weakness.  The illness is also significant for chills and bloating.   [name] is a 29 y.o. [phone] @ [redacted]w[redacted]d gestation who presents to the MAU with nausea and diarrhea that started yesterday. She reports going about 4 times a day with watery yellow stools. Patient states every time she drinks or tries to eat a piece of toast she has diarrhea.    Past Medical History  Diagnosis Date  . Anxiety   . Acid reflux   . Infertility management   . Gestational diabetes   . Classic migraine 09/12/2014   Past Surgical History  Procedure Laterality Date  . Tonsillectomy    . Dilation and curettage of uterus     Family History  Problem Relation Age of Onset  . Diabetes Mother   . Cervical cancer Mother   . Diabetes Father   . Heart disease Father   . Diabetes Maternal Grandmother   . Migraines Daughter   . Colon polyps Mother   . Liver disease Father   . Kidney disease Maternal Grandmother    Social History  Substance Use Topics  . Smoking status: Never Smoker   . Smokeless tobacco: Never Used  . Alcohol Use: No   OB History    Gravida Para Term Preterm AB TAB SAB Ectopic Multiple Living   Review of Systems  Constitutional: Positive for chills. Negative for fever.  Gastrointestinal: Positive for nausea, diarrhea and bloating. Negative for abdominal pain.   Genitourinary: Negative for dysuria.  Musculoskeletal: Positive for myalgias.  Neurological: Positive for weakness and headaches.   All other systems negative   Allergies  Cephalosporins and Latex  Home Medications   Prior to Admission medications   Medication Sig Start Date End Date Taking? Authorizing Provider  acetaminophen (TYLENOL) 325 MG tablet Take 325 mg by mouth as needed for fever.    Yes Historical Provider, MD  calcium carbonate (TUMS - DOSED IN MG ELEMENTAL CALCIUM) 500 MG chewable tablet Chew 2 tablets by mouth daily.   Yes Historical Provider, MD  Doxylamine-Pyridoxine (DICLEGIS) 10-10 MG TBEC Take 1 tablet by mouth daily as needed (nausea and vomitting).   Yes Historical Provider, MD  famotidine (PEPCID) 40 MG tablet Take 1 tablet (40 mg total) by mouth 2 (two) times daily. Patient taking differently: Take 40 mg by mouth daily.  03/02/15  Yes Amy S Esterwood, PA-C  Prenatal Vit-Fe Fumarate-FA (PRENATAL MULTIVITAMIN) TABS tablet Take 1 tablet by mouth daily at 12 noon.   Yes Historical Provider, MD  PROGESTERONE VA Place 1 suppository vaginally 2 (two) times daily.   Yes Historical Provider, MD  albuterol (PROVENTIL HFA;VENTOLIN HFA) 108 (90 BASE) MCG/ACT inhaler Inhale 2 puffs into the lungs every 4 (four) hours as needed for wheezing or shortness of breath (cough, shortness of breath or wheezing.). Patient not taking: Reported on 07/19/2015 06/24/15   Maylon Peppers  Daub, MD  azithromycin (ZITHROMAX) 250 MG tablet Take 2 today then one a day for 4 days Patient not taking: Reported on 07/19/2015 06/24/15   Collene Gobble, MD   BP 119/74 mmHg  Pulse 90  Temp(Src) 98 F (36.7 C) (Oral)  Resp 20  Ht  (1.626 m)  Wt 102.513 kg (226 lb)  BMI 38.77 kg/m2  LMP 06/01/2015 (Exact Date) Physical Exam  Constitutional: She is oriented to person, place, and time. She appears well-developed and well-nourished.  HENT:  Head: Normocephalic and atraumatic.  Eyes: Conjunctivae and EOM  are normal.  Neck: Neck supple.  Cardiovascular: Normal rate and regular rhythm.   Pulmonary/Chest: Effort normal and breath sounds normal.  Abdominal: Soft. Bowel sounds are normal. There is no tenderness.  Musculoskeletal: Normal range of motion.  Neurological: She is alert and oriented to person, place, and time. No cranial nerve deficit.  Skin: Skin is warm and dry.  Psychiatric: She has a normal mood and affect. Her behavior is normal.  Nursing note and vitals reviewed.   ED Course  Procedures (including critical care time) Consult with Dr. Vincente Poli, will continue IV hydration and medications for nausea, will order ultrasound.  Will plan to d/c home with medication for nausea.   Labs Review Results for orders placed or performed during the hospital encounter of 07/19/15 (from the past 24 hour(s))  Urinalysis, Routine w reflex microscopic (not at [facility])     Status: Abnormal   Collection Time: 07/19/15  7:30 PM  Result Value Ref Range   Color, Urine YELLOW YELLOW   APPearance CLEAR CLEAR   Specific Gravity, Urine >1.030 (H) 1.005 - 1.030   pH 5.5 5.0 - 8.0   Glucose, UA NEGATIVE NEGATIVE mg/dL   Hgb urine dipstick TRACE (A) NEGATIVE   Bilirubin Urine NEGATIVE NEGATIVE   Ketones, ur 15 (A) NEGATIVE mg/dL   Protein, ur NEGATIVE NEGATIVE mg/dL   Urobilinogen, UA 0.2 0.0 - 1.0 mg/dL   Nitrite NEGATIVE NEGATIVE   Leukocytes, UA NEGATIVE NEGATIVE  Urine microscopic-add on     Status: Abnormal   Collection Time: 07/19/15  7:30 PM  Result Value Ref Range   Squamous Epithelial / LPF MANY (A) RARE   RBC / HPF 0-2 <3 RBC/hpf   Bacteria, UA MANY (A) RARE   Urine-Other MUCOUS PRESENT   Basic metabolic panel     Status: None   Collection Time: 07/19/15  8:42 PM  Result Value Ref Range   Sodium 136 135 - 145 mmol/L   Potassium 3.7 3.5 - 5.1 mmol/L   Chloride 108 101 - 111 mmol/L   CO2 23 22 - 32 mmol/L   Glucose, Bld 98 65 - 99 mg/dL   BUN 13 6 - 20 mg/dL   Creatinine, Ser 1.61  0.44 - 1.00 mg/dL   Calcium 9.5 8.9 - 09.6 mg/dL   GFR calc non Af Amer >60 >60 mL/min   GFR calc Af Amer >60 >60 mL/min   Anion gap 5 5 - 15  Pregnancy, urine POC     Status: Abnormal   Collection Time: 07/19/15  9:27 PM  Result Value Ref Range   Preg Test, Ur POSITIVE (A) NEGATIVE    US Ob Comp Less 14 Wks  07/20/2015   CLINICAL DATA:  Nausea, vomiting, abdominal pain for 2 days. Patient is 6 weeks 6 days by LMP. EDC by LMP is 03/07/2016. LMP 06/01/2015.  EXAM: OBSTETRIC <14 WK [place] AND TRANSVAGINAL OB US  TECHNIQUE:  Both transabdominal and transvaginal ultrasound examinations were performed for complete evaluation of the gestation as well as the maternal uterus, adnexal regions, and pelvic cul-de-sac. Transvaginal technique was performed to assess early pregnancy.  COMPARISON:  None.  FINDINGS: Intrauterine gestational sac: Visualized/normal in shape.  Yolk sac:  Present  Embryo:  Present  Cardiac Activity: Present  Heart Rate: 123  bpm  CRL:  8.3  mm   6 w   5 d                  [place] EDC: 03/09/2016  Maternal uterus/adnexae: No subchorionic hemorrhage. Small right corpus luteum cyst. Left ovary has a normal appearance. No free pelvic fluid.  IMPRESSION: 1. Single living intrauterine embryo. 2. Size by ultrasound confirms clinical EDC of 03/07/2016.   Electronically Signed   By: Norva Pavlov M.D.   On: 07/20/2015 00:10   US Ob Transvaginal  07/20/2015   CLINICAL DATA:  Nausea, vomiting, abdominal pain for 2 days. Patient is 6 weeks 6 days by LMP. EDC by LMP is 03/07/2016. LMP 06/01/2015.  EXAM: OBSTETRIC <14 WK [place] AND TRANSVAGINAL OB US  TECHNIQUE: Both transabdominal and transvaginal ultrasound examinations were performed for complete evaluation of the gestation as well as the maternal uterus, adnexal regions, and pelvic cul-de-sac. Transvaginal technique was performed to assess early pregnancy.  COMPARISON:  None.  FINDINGS: Intrauterine gestational sac: Visualized/normal in shape.  Yolk sac:   Present  Embryo:  Present  Cardiac Activity: Present  Heart Rate: 123  bpm  CRL:  8.3  mm   6 w   5 d                  [place] EDC: 03/09/2016  Maternal uterus/adnexae: No subchorionic hemorrhage. Small right corpus luteum cyst. Left ovary has a normal appearance. No free pelvic fluid.  IMPRESSION: 1. Single living intrauterine embryo. 2. Size by ultrasound confirms clinical EDC of 03/07/2016.   Electronically Signed   By: Norva Pavlov M.D.   On: 07/20/2015 00:10    MDM  Care turned over to Thressa Sheller, CNM @ 11:45 PM. Patient in ultrasound.     Assessment/Plan  1. Pregnancy related pelvic pain in first trimester, antepartum   2. Nausea/vomiting in pregnancy    DC home Comfort measures reviewed  1st Trimester precautions  RX: phenergan PRN #[id number]  Return to MAU as needed Clear liquid diet for 24 hours followed by BRAT diet   Follow-up Information    Follow up with Jeani Hawking, MD.   Specialty:  Obstetrics and Gynecology   Why:  As scheduled   Contact information:   [address] [place] [address] [phone]

## 2015-07-19 NOTE — MAU Note (Signed)
PT SAYS  H/A  STARTED  YESTERDAY-  NO MEDS.    STARTED  DIARRHEA-  WATERY-     X4.        SHE  CALLED  DR  ADKINS   TODAY  -  TODAY  TOLD  TO COME  IN.      NO VOMITING.  FEELS  NAUSEA

## 2015-07-20 DIAGNOSIS — O219 Vomiting of pregnancy, unspecified: Secondary | ICD-10-CM

## 2015-07-20 MED ORDER — PROMETHAZINE HCL 25 MG PO TABS: 12.5000 mg | ORAL_TABLET | Freq: Four times a day (QID) | ORAL | Status: DC | PRN

## 2015-07-20 NOTE — Discharge Instructions (Signed)
Clear liquid diet only for 24 hours then follow with    Food Choices to Help Relieve Diarrhea When you have diarrhea, the foods you eat and your eating habits are very important. Choosing the right foods and drinks can help relieve diarrhea. Also, because diarrhea can last up to 7 days, you need to replace lost fluids and electrolytes (such as sodium, potassium, and chloride) in order to help prevent dehydration.  WHAT GENERAL GUIDELINES DO I NEED TO FOLLOW?  Slowly drink 1 cup (8 oz) of fluid for each episode of diarrhea. If you are getting enough fluid, your urine will be clear or pale yellow.  Eat starchy foods. Some good choices include white rice, white toast, pasta, low-fiber cereal, baked potatoes (without the skin), saltine crackers, and bagels.  Avoid large servings of any cooked vegetables.  Limit fruit to two servings per day. A serving is  cup or 1 small piece.  Choose foods with less than 2 g of fiber per serving.  Limit fats to less than 8 tsp (38 g) per day.  Avoid fried foods.  Eat foods that have probiotics in them. Probiotics can be found in certain dairy products.  Avoid foods and beverages that may increase the speed at which food moves through the stomach and intestines (gastrointestinal tract). Things to avoid include:  High-fiber foods, such as dried fruit, raw fruits and vegetables, nuts, seeds, and whole grain foods.  Spicy foods and high-fat foods.  Foods and beverages sweetened with high-fructose corn syrup, honey, or sugar alcohols such as xylitol, sorbitol, and mannitol. WHAT FOODS ARE RECOMMENDED? Grains White rice. White, [place], or pita breads (fresh or toasted), including plain rolls, buns, or bagels. White pasta. Saltine, soda, or graham crackers. Pretzels. Low-fiber cereal. Cooked cereals made with water (such as cornmeal, farina, or cream cereals). Plain muffins. Matzo. Melba toast. Zwieback.  Vegetables Potatoes (without the skin). Strained  tomato and vegetable juices. Most well-cooked and canned vegetables without seeds. Tender lettuce. Fruits Cooked or canned applesauce, apricots, cherries, fruit cocktail, grapefruit, peaches, pears, or plums. Fresh bananas, apples without skin, cherries, grapes, cantaloupe, grapefruit, peaches, oranges, or plums.  Meat and Other Protein Products Baked or boiled chicken. Eggs. Tofu. Fish. Seafood. Smooth peanut butter. Ground or well-cooked tender beef, ham, veal, lamb, pork, or poultry.  Dairy Plain yogurt, kefir, and unsweetened liquid yogurt. Lactose-free milk, buttermilk, or soy milk. Plain hard cheese. Beverages Sport drinks. Clear broths. Diluted fruit juices (except prune). Regular, caffeine-free sodas such as ginger ale. Water. Decaffeinated teas. Oral rehydration solutions. Sugar-free beverages not sweetened with sugar alcohols. Other Bouillon, broth, or soups made from recommended foods.  The items listed above may not be a complete list of recommended foods or beverages. Contact your dietitian for more options. WHAT FOODS ARE NOT RECOMMENDED? Grains Whole grain, whole wheat, bran, or rye breads, rolls, pastas, crackers, and cereals. Wild or brown rice. Cereals that contain more than 2 g of fiber per serving. Corn tortillas or taco shells. Cooked or dry oatmeal. Granola. Popcorn. Vegetables Raw vegetables. Cabbage, broccoli, Brussels sprouts, artichokes, baked beans, beet greens, corn, kale, legumes, peas, sweet potatoes, and yams. Potato skins. Cooked spinach and cabbage. Fruits Dried fruit, including raisins and dates. Raw fruits. Stewed or dried prunes. Fresh apples with skin, apricots, mangoes, pears, raspberries, and strawberries.  Meat and Other Protein Products Chunky peanut butter. Nuts and seeds. Beans and lentils. [name].  Dairy High-fat cheeses. Milk, chocolate milk, and beverages made with milk, such as milk shakes.  Cream. Ice cream. Sweets and Desserts Sweet rolls,  doughnuts, and sweet breads. Pancakes and waffles. Fats and Oils Butter. Cream sauces. Margarine. Salad oils. Plain salad dressings. Olives. Avocados.  Beverages Caffeinated beverages (such as coffee, tea, soda, or energy drinks). Alcoholic beverages. Fruit juices with pulp. Prune juice. Soft drinks sweetened with high-fructose corn syrup or sugar alcohols. Other Coconut. Hot sauce. Chili powder. Mayonnaise. Gravy. Cream-based or milk-based soups.  The items listed above may not be a complete list of foods and beverages to avoid. Contact your dietitian for more information. WHAT SHOULD I DO IF I BECOME DEHYDRATED? Diarrhea can sometimes lead to dehydration. Signs of dehydration include dark urine and dry mouth and skin. If you think you are dehydrated, you should rehydrate with an oral rehydration solution. These solutions can be purchased at pharmacies, retail stores, or online.  Drink -1 cup (120-240 mL) of oral rehydration solution each time you have an episode of diarrhea. If drinking this amount makes your diarrhea worse, try drinking smaller amounts more often. For example, drink 1-3 tsp (5-15 mL) every 5-10 minutes.  A general rule for staying hydrated is to drink 1-2 L of fluid per day. Talk to your health care provider about the specific amount you should be drinking each day. Drink enough fluids to keep your urine clear or pale yellow. Document Released: 01/03/2004 Document Revised: 10/18/2013 Document Reviewed: 09/05/2013 [facility] Patient Information 2015 [place], [place]. This information is not intended to replace advice given to you by your health care provider. Make sure you discuss any questions you have with your health care provider.

## 2015-08-01 LAB — OB RESULTS CONSOLE GC/CHLAMYDIA
Chlamydia: NEGATIVE
GC PROBE AMP, GENITAL: NEGATIVE

## 2015-08-01 LAB — OB RESULTS CONSOLE HIV ANTIBODY (ROUTINE TESTING): HIV: NONREACTIVE

## 2015-08-01 LAB — OB RESULTS CONSOLE ABO/RH: RH Type: POSITIVE

## 2015-08-01 LAB — OB RESULTS CONSOLE RUBELLA ANTIBODY, IGM: RUBELLA: IMMUNE

## 2015-08-01 LAB — OB RESULTS CONSOLE HEPATITIS B SURFACE ANTIGEN: Hepatitis B Surface Ag: NEGATIVE

## 2015-08-01 LAB — OB RESULTS CONSOLE ANTIBODY SCREEN: ANTIBODY SCREEN: NEGATIVE

## 2015-08-01 LAB — OB RESULTS CONSOLE RPR: RPR: NONREACTIVE

## 2015-08-15 ENCOUNTER — Ambulatory Visit: Payer: 59 | Admitting: Internal Medicine

## 2015-08-18 ENCOUNTER — Encounter (HOSPITAL_COMMUNITY): Payer: Self-pay

## 2015-08-18 ENCOUNTER — Inpatient Hospital Stay (HOSPITAL_COMMUNITY)
Admission: AD | Admit: 2015-08-18 | Discharge: 2015-08-19 | Disposition: A | Discharge status: Home / Self Care | Payer: 59 | Source: Ambulatory Visit | Attending: Obstetrics and Gynecology | Admitting: Obstetrics and Gynecology

## 2015-08-18 DIAGNOSIS — O26891 Other specified pregnancy related conditions, first trimester: Secondary | ICD-10-CM | POA: Insufficient documentation

## 2015-08-18 DIAGNOSIS — Z3A11 11 weeks gestation of pregnancy: Secondary | ICD-10-CM | POA: Insufficient documentation

## 2015-08-18 DIAGNOSIS — O218 Other vomiting complicating pregnancy: Secondary | ICD-10-CM | POA: Insufficient documentation

## 2015-08-18 DIAGNOSIS — K219 Gastro-esophageal reflux disease without esophagitis: Secondary | ICD-10-CM | POA: Insufficient documentation

## 2015-08-18 DIAGNOSIS — O219 Vomiting of pregnancy, unspecified: Secondary | ICD-10-CM

## 2015-08-18 DIAGNOSIS — R109 Unspecified abdominal pain: Secondary | ICD-10-CM | POA: Insufficient documentation

## 2015-08-18 DIAGNOSIS — R12 Heartburn: Secondary | ICD-10-CM | POA: Insufficient documentation

## 2015-08-18 LAB — COMPREHENSIVE METABOLIC PANEL
ALT: 16 U/L (ref 14–54)
AST: 15 U/L (ref 15–41)
Albumin: 3.2 g/dL — ABNORMAL LOW (ref 3.5–5.0)
Alkaline Phosphatase: 60 U/L (ref 38–126)
Anion gap: 4 — ABNORMAL LOW (ref 5–15)
BUN: 8 mg/dL (ref 6–20)
CO2: 24 mmol/L (ref 22–32)
Calcium: 8.8 mg/dL — ABNORMAL LOW (ref 8.9–10.3)
Chloride: 106 mmol/L (ref 101–111)
Creatinine, Ser: 0.49 mg/dL (ref 0.44–1.00)
Glucose, Bld: 120 mg/dL — ABNORMAL HIGH (ref 65–99)
POTASSIUM: 3.6 mmol/L (ref 3.5–5.1)
Sodium: 134 mmol/L — ABNORMAL LOW (ref 135–145)
Total Bilirubin: 0.9 mg/dL (ref 0.3–1.2)
Total Protein: 6.2 g/dL — ABNORMAL LOW (ref 6.5–8.1)

## 2015-08-18 LAB — CBC
HCT: 37.6 % (ref 36.0–46.0)
Hemoglobin: 13 g/dL (ref 12.0–15.0)
MCH: 29.2 pg (ref 26.0–34.0)
MCHC: 34.6 g/dL (ref 30.0–36.0)
MCV: 84.5 fL (ref 78.0–100.0)
PLATELETS: 234 10*3/uL (ref 150–400)
RBC: 4.45 MIL/uL (ref 3.87–5.11)
RDW: 13.8 % (ref 11.5–15.5)
WBC: 13 10*3/uL — ABNORMAL HIGH (ref 4.0–10.5)

## 2015-08-18 LAB — URINALYSIS, ROUTINE W REFLEX MICROSCOPIC
BILIRUBIN URINE: NEGATIVE
Glucose, UA: NEGATIVE mg/dL
HGB URINE DIPSTICK: NEGATIVE
Ketones, ur: 15 mg/dL — AB
Leukocytes, UA: NEGATIVE
Nitrite: NEGATIVE
PH: 7 (ref 5.0–8.0)
Protein, ur: NEGATIVE mg/dL
SPECIFIC GRAVITY, URINE: 1.025 (ref 1.005–1.030)
UROBILINOGEN UA: 0.2 mg/dL (ref 0.0–1.0)

## 2015-08-18 LAB — AMYLASE: AMYLASE: 44 U/L (ref 28–100)

## 2015-08-18 LAB — LIPASE, BLOOD: LIPASE: 28 U/L (ref 11–51)

## 2015-08-18 MED ORDER — LACTATED RINGERS IV BOLUS (SEPSIS)
1000.0000 mL | Freq: Once | INTRAVENOUS | Status: AC
  Administered 2015-08-18: 1000 mL via INTRAVENOUS

## 2015-08-18 MED ORDER — FAMOTIDINE IN NACL 20-0.9 MG/50ML-% IV SOLN
20.0000 mg | Freq: Once | INTRAVENOUS | Status: AC
  Administered 2015-08-18: 20 mg via INTRAVENOUS
  Filled 2015-08-18: qty 50

## 2015-08-18 MED ORDER — METOCLOPRAMIDE HCL 5 MG/ML IJ SOLN
10.0000 mg | Freq: Once | INTRAMUSCULAR | Status: AC
  Administered 2015-08-18: 10 mg via INTRAVENOUS
  Filled 2015-08-18: qty 2

## 2015-08-18 NOTE — MAU Note (Addendum)
Have had n/v since 6wks. Worse today. Changed from Pepcid to Nexium on Monday by GI doc. Had endo in July and had gastritis. Having a lot of upper abd pain, almost like spasms. Have had diarrhea since 6wks of pregnancy and stools are yellow.. Had gallbladder u/s in April and was ok. Nausea worse since lunch and finally threw up at 1800 and was undigested food. Very tired, weak, and hot and cold at times

## 2015-08-18 NOTE — MAU Provider Note (Signed)
History     CSN: [id number]  Arrival date and time: 08/18/15 2155   First Provider Initiated Contact with Patient 08/18/15 2242      Chief Complaint  Patient presents with  . Abdominal Pain  . Emesis During Pregnancy   HPI Ms. [name] is a 29 y.o. [id number] at 3960w1d who presents to MAU today with complaint of nausea and vomiting. The patient states that this has been an issue since very early in the pregnancy. She has tried Diclegis with minimal relief. She was given Phenergan recently, but did not take it because when it was given in MAU by IV she felt anxious and jittery. She has been taking Nexium for heartburn which is helping. She still complains of mild epigastric midline pain. She has had some loose stools recently, but denies diarrhea. She states that this has been an issue for many weeks as well. She also has light colored stools. She denies vaginal bleeding, lower abdominal pain, fever or UTI symptoms.   OB History    Gravida Para Term Preterm AB TAB SAB Ectopic Multiple Living   5 2 1 1 2  2   2       Past Medical History  Diagnosis Date  . Anxiety   . Acid reflux   . Infertility management   . Gestational diabetes   . Classic migraine 09/12/2014    Past Surgical History  Procedure Laterality Date  . Tonsillectomy    . Dilation and curettage of uterus      Family History  Problem Relation Age of Onset  . Diabetes Mother   . Cervical cancer Mother   . Diabetes Father   . Heart disease Father   . Diabetes Maternal Grandmother   . Migraines Daughter   . Colon polyps Mother   . Liver disease Father   . Kidney disease Maternal Grandmother     Social History  Substance Use Topics  . Smoking status: Never Smoker   . Smokeless tobacco: Never Used  . Alcohol Use: No    Allergies:  Allergies  Allergen Reactions  . Cephalosporins     Had an allergy test and was told was allergic  . Latex Other (See Comments)    Rash, swelling itching    No  prescriptions prior to admission    Review of Systems  Constitutional: Negative for fever and malaise/fatigue.  Gastrointestinal: Positive for nausea, vomiting and abdominal pain. Negative for heartburn, diarrhea and constipation.  Genitourinary: Negative for dysuria, urgency and frequency.       Neg - vaginal bleeding   Physical Exam   Blood pressure 117/69, pulse 80, temperature 98.2 F (36.8 C), resp. rate 18, height 5\' 5"  (1.651 m), weight 226 lb 12.8 oz (102.876 kg), last menstrual period 06/01/2015, currently breastfeeding.  Physical Exam  Nursing note and vitals reviewed. Constitutional: She is oriented to person, place, and time. She appears well-developed and well-nourished. No distress.  HENT:  Head: Normocephalic and atraumatic.  Cardiovascular: Normal rate.   Respiratory: Effort normal.  GI: Soft. She exhibits no distension and no mass. There is tenderness (mild tenderness to palpation of the mid epigastric region). There is no rebound and no guarding.  Neurological: She is alert and oriented to person, place, and time.  Skin: Skin is warm and dry. No erythema.  Psychiatric: She has a normal mood and affect.   Results for orders placed or performed during the hospital encounter of 08/18/15 (from the past 24 hour(s))  Urinalysis, Routine w reflex microscopic (not at [facility])     Status: Abnormal   Collection Time: 08/18/15 10:20 PM  Result Value Ref Range   Color, Urine YELLOW YELLOW   APPearance CLEAR CLEAR   Specific Gravity, Urine 1.025 1.005 - 1.030   pH 7.0 5.0 - 8.0   Glucose, UA NEGATIVE NEGATIVE mg/dL   Hgb urine dipstick NEGATIVE NEGATIVE   Bilirubin Urine NEGATIVE NEGATIVE   Ketones, ur 15 (A) NEGATIVE mg/dL   Protein, ur NEGATIVE NEGATIVE mg/dL   Urobilinogen, UA 0.2 0.0 - 1.0 mg/dL   Nitrite NEGATIVE NEGATIVE   Leukocytes, UA NEGATIVE NEGATIVE  CBC     Status: Abnormal   Collection Time: 08/18/15 10:55 PM  Result Value Ref Range   WBC 13.0 (H) 4.0 -  10.5 K/uL   RBC 4.45 3.87 - 5.11 MIL/uL   Hemoglobin 13.0 12.0 - 15.0 g/dL   HCT 16.1 09.6 - 04.5 %   MCV 84.5 78.0 - 100.0 fL   MCH 29.2 26.0 - 34.0 pg   MCHC 34.6 30.0 - 36.0 g/dL   RDW 40.9 81.1 - 91.4 %   Platelets 234 150 - 400 K/uL  Comprehensive metabolic panel     Status: Abnormal   Collection Time: 08/18/15 10:55 PM  Result Value Ref Range   Sodium 134 (L) 135 - 145 mmol/L   Potassium 3.6 3.5 - 5.1 mmol/L   Chloride 106 101 - 111 mmol/L   CO2 24 22 - 32 mmol/L   Glucose, Bld 120 (H) 65 - 99 mg/dL   BUN 8 6 - 20 mg/dL   Creatinine, Ser 7.82 0.44 - 1.00 mg/dL   Calcium 8.8 (L) 8.9 - 10.3 mg/dL   Total Protein 6.2 (L) 6.5 - 8.1 g/dL   Albumin 3.2 (L) 3.5 - 5.0 g/dL   AST 15 15 - 41 U/L   ALT 16 14 - 54 U/L   Alkaline Phosphatase 60 38 - 126 U/L   Total Bilirubin 0.9 0.3 - 1.2 mg/dL   GFR calc non Af Amer >60 >60 mL/min   GFR calc Af Amer >60 >60 mL/min   Anion gap 4 (L) 5 - 15  Amylase     Status: None   Collection Time: 08/18/15 10:55 PM  Result Value Ref Range   Amylase 44 28 - 100 U/L  Lipase, blood     Status: None   Collection Time: 08/18/15 10:55 PM  Result Value Ref Range   Lipase 28 11 - 51 U/L     MAU Course  Procedures None  MDM FHR - 167 bpm with doppler IV LR with 10 mg Reglan and 20 mg Pepcid given in MAU CBC, CMP, Amylase and Lipase today Discussed with Dr. Marcelle Overlie. Recommends Rx for Zofran and continue Nexium. Follow-up in the office as scheduled Assessment and Plan  A: [facility] at [redacted]w[redacted]d Nausea and vomiting in pregnancy prior to [redacted] weeks gestation Heartburn in pregnancy Abdominal pain in pregnancy  P: Discharge home Rx for Zofran given to patient Advised to continue Nexium as previously prescribed Diet for GERD and N/V in pregnancy and first trimester precautions discussed Patient advised to follow-up with [facility] as scheduled for routine prenatal care or sooner PRN Patient may return to MAU as needed or if her condition  were to change or worsen   Marny Lowenstein, PA-C  08/19/2015, 2:48 AM

## 2015-08-19 DIAGNOSIS — O219 Vomiting of pregnancy, unspecified: Secondary | ICD-10-CM

## 2015-08-19 MED ORDER — ONDANSETRON HCL 4 MG PO TABS: 4.0000 mg | ORAL_TABLET | Freq: Four times a day (QID) | ORAL | Status: DC

## 2015-08-19 NOTE — Discharge Instructions (Signed)
Eating Plan for Hyperemesis Gravidarum Severe cases of hyperemesis gravidarum can lead to dehydration and malnutrition. The hyperemesis eating plan is one way to lessen the symptoms of nausea and vomiting. It is often used with prescribed medicines to control your symptoms.  WHAT CAN I DO TO RELIEVE MY SYMPTOMS? Listen to your body. Everyone is different and has different preferences. Find what works best for you. Some of the following things may help:  Eat and drink slowly.  Eat 5-6 small meals daily instead of 3 large meals.   Eat crackers before you get out of bed in the morning.   Starchy foods are usually well tolerated (such as cereal, toast, bread, potatoes, pasta, rice, and pretzels).   Ginger may help with nausea. Add  tsp ground ginger to hot tea or choose ginger tea.   Try drinking 100% fruit juice or an electrolyte drink.  Continue to take your prenatal vitamins as directed by your health care provider. If you are having trouble taking your prenatal vitamins, talk with your health care provider about different options.  Include at least 1 serving of protein with your meals and snacks (such as meats or poultry, beans, nuts, eggs, or yogurt). Try eating a protein-rich snack before bed (such as cheese and crackers or a half [place] or peanut butter sandwich). WHAT THINGS SHOULD I AVOID TO REDUCE MY SYMPTOMS? The following things may help reduce your symptoms:  Avoid foods with strong smells. Try eating meals in well-ventilated areas that are free of odors.  Avoid drinking water or other beverages with meals. Try not to drink anything less than 30 minutes before and after meals.  Avoid drinking more than 1 cup of fluid at a time.  Avoid fried or high-fat foods, such as butter and cream sauces.  Avoid spicy foods.  Avoid skipping meals the best you can. Nausea can be more intense on an empty stomach. If you cannot tolerate food at that time, do not force it. Try sucking on  ice chips or other frozen items and make up the calories later.  Avoid lying down within 2 hours after eating.   This information is not intended to replace advice given to you by your health care provider. Make sure you discuss any questions you have with your health care provider.   Document Released: 08/10/2007 Document Revised: 10/18/2013 Document Reviewed: 08/17/2013 Elsevier Interactive Patient Education 2016 [employer] Heartburn During Pregnancy Heartburn is a burning sensation in the chest caused by stomach acid backing up into the esophagus. Heartburn is common in pregnancy because a certain hormone (progesterone) is released when a woman is pregnant. The progesterone hormone may relax the valve that separates the esophagus from the stomach. This allows acid to go up into the esophagus, causing heartburn. Heartburn may also happen in pregnancy because the enlarging uterus pushes up on the stomach, which pushes more acid into the esophagus. This is especially true in the later stages of pregnancy. Heartburn problems usually go away after giving birth. CAUSES  Heartburn is caused by stomach acid backing up into the esophagus. During pregnancy, this may result from various things, including:   The progesterone hormone.  Changing hormone levels.  The growing uterus pushing stomach acid upward.  Large meals.  Certain foods and drinks.  Exercise.  Increased acid production. SIGNS AND SYMPTOMS   Burning pain in the chest or lower throat.  Bitter taste in the mouth.  Coughing. DIAGNOSIS  Your health care provider will typically diagnose heartburn  by taking a careful history of your concern. Blood tests may be done to check for a certain type of bacteria that is associated with heartburn. Sometimes, heartburn is diagnosed by prescribing a heartburn medicine to see if the symptoms improve. In some cases, a procedure called an endoscopy may be done. In this procedure, a tube with  a light and a camera on the end (endoscope) is used to examine the esophagus and the stomach. TREATMENT  Treatment will vary depending on the severity of your symptoms. Your health care provider may recommend:  Over-the-counter medicines (antacids, acid reducers) for mild heartburn.  Prescription medicines to decrease stomach acid or to protect your stomach lining.  Certain changes in your diet.  Elevating the head of your bed by putting blocks under the legs. This helps prevent stomach acid from backing up into the esophagus when you are lying down. HOME CARE INSTRUCTIONS   Only take over-the-counter or prescription medicines as directed by your health care provider.  Raise the head of your bed by putting blocks under the legs if instructed to do so by your health care provider. Sleeping with more pillows is not effective because it only changes the position of your head.  Do not exercise right after eating.  Avoid eating 2-3 hours before bed. Do not lie down right after eating.  Eat small meals throughout the day instead of three large meals.  Identify foods and beverages that make your symptoms worse and avoid them. Foods you may want to avoid include:  Peppers.  Chocolate.  High-fat foods, including fried foods.  Spicy foods.  Garlic and onions.  Citrus fruits, including oranges, grapefruit, lemons, and limes.  Food containing tomatoes or tomato products.  Mint.  Carbonated and caffeinated drinks.  Vinegar. SEEK MEDICAL CARE IF:  You have abdominal pain of any kind.  You feel burning in your upper abdomen or chest, especially after eating or lying down.  You have nausea and vomiting.  Your stomach feels upset after you eat. SEEK IMMEDIATE MEDICAL CARE IF:   You have severe chest pain that goes down your arm or into your jaw or neck.  You feel sweaty, dizzy, or light-headed.  You become short of breath.  You vomit blood.  You have difficulty or pain  with swallowing.  You have bloody or black, tarry stools.  You have episodes of heartburn more than 3 times a week, for more than 2 weeks. MAKE SURE YOU:  Understand these instructions.  Will watch your condition.  Will get help right away if you are not doing well or get worse.   This information is not intended to replace advice given to you by your health care provider. Make sure you discuss any questions you have with your health care provider.   Document Released: 10/10/2000 Document Revised: 11/03/2014 Document Reviewed: 06/01/2013 Elsevier Interactive Patient Education [employer]

## 2015-09-10 IMAGING — US US ABDOMEN COMPLETE
1 series · 14 of 25 positions shown · non-contrast
Comparison: None.

CLINICAL DATA: Right upper quadrant pain.

EXAM:
ULTRASOUND ABDOMEN COMPLETE

[Series 1: us abdomen complete · 0.24mm/px · 14 of 71 slices shown]
[im 1/71]
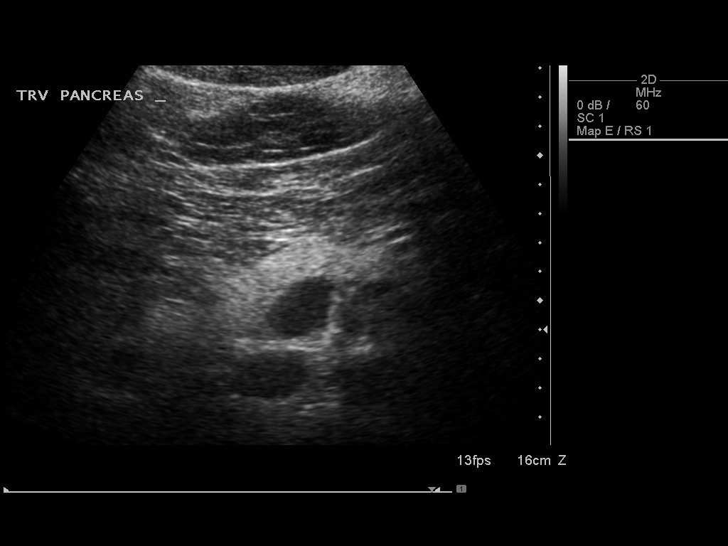
[im 6/71]
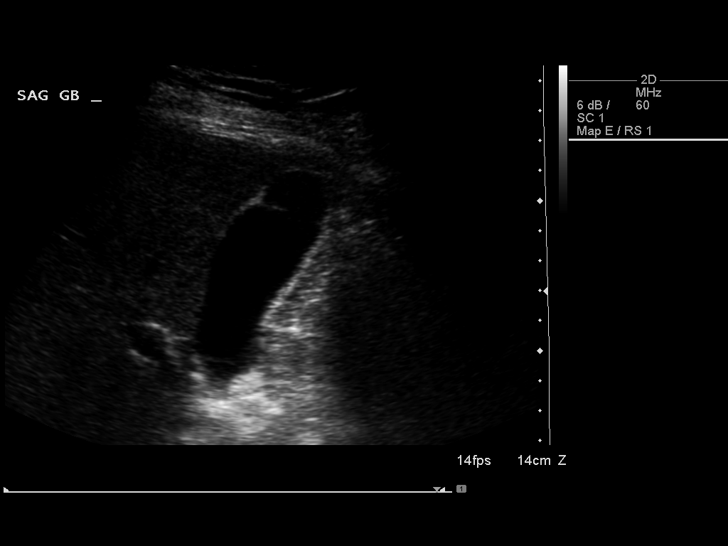
[im 12/71]
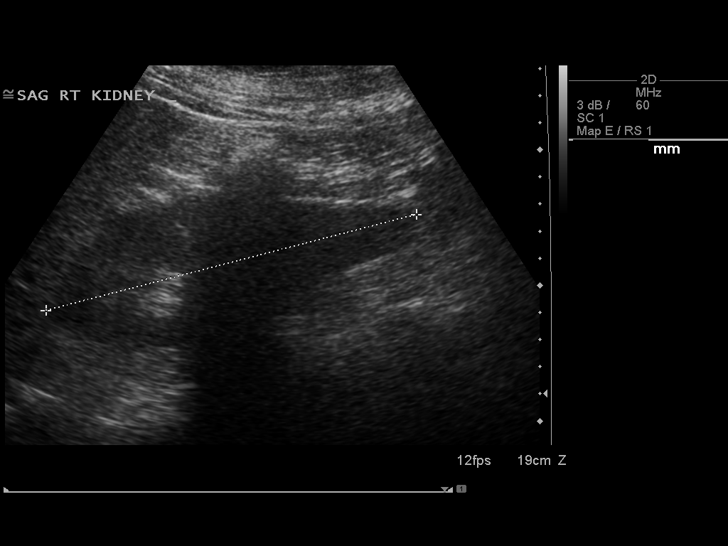
[im 18/71]
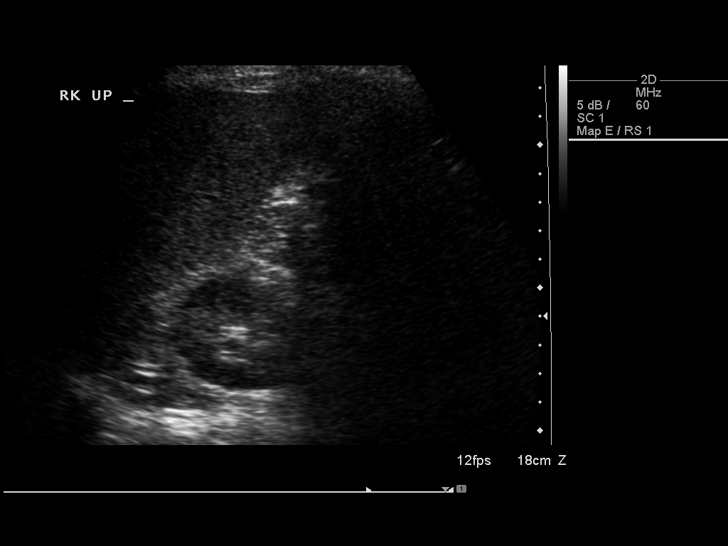
[im 24/71]
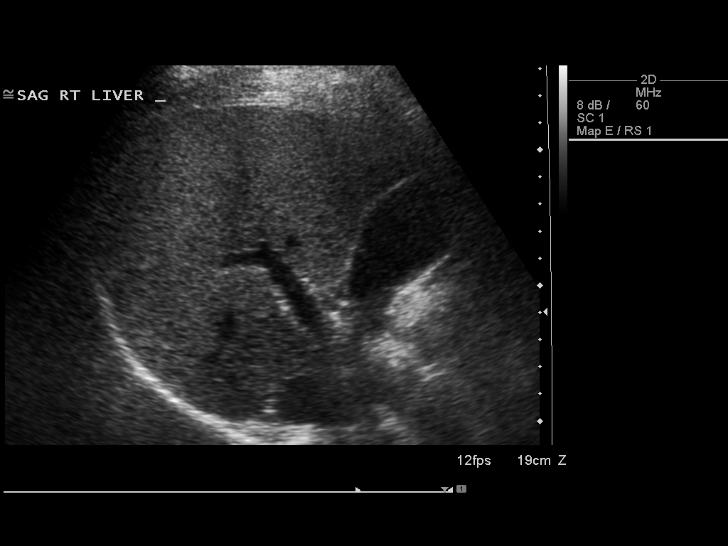
[im 27/71]
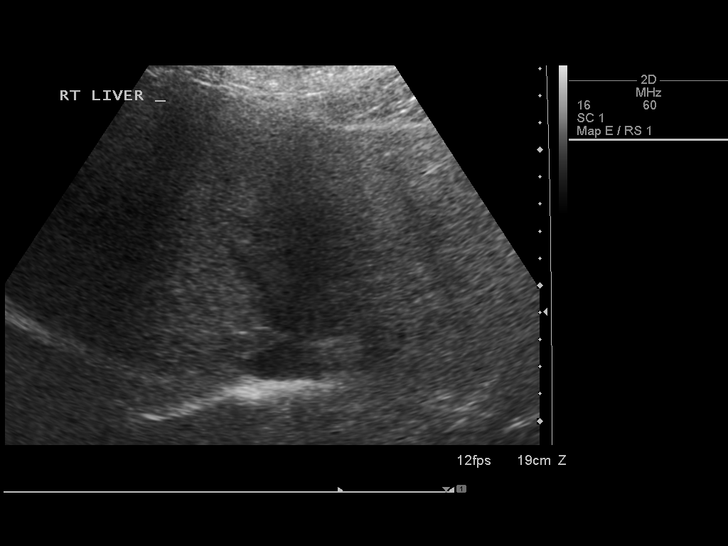
[im 33/71]
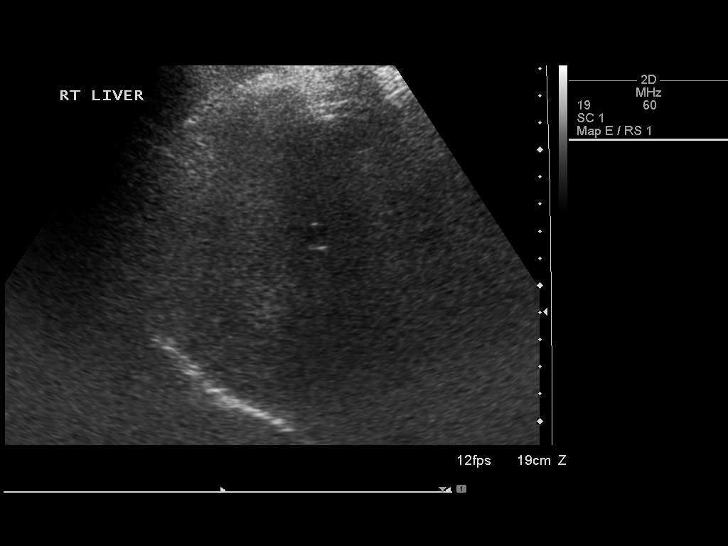
[im 38/71]
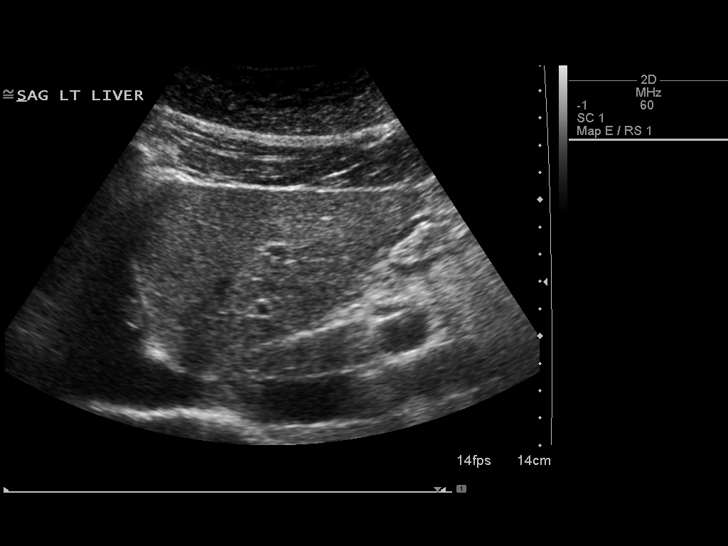
[im 44/71]
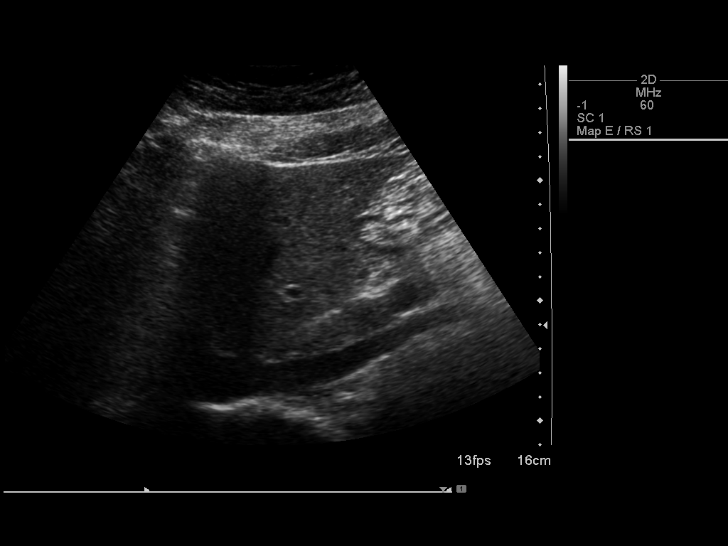
[im 47/71]
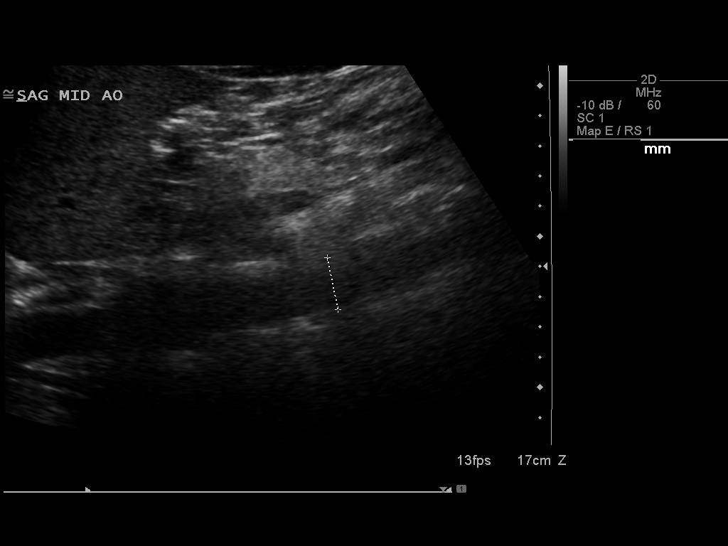
[im 53/71]
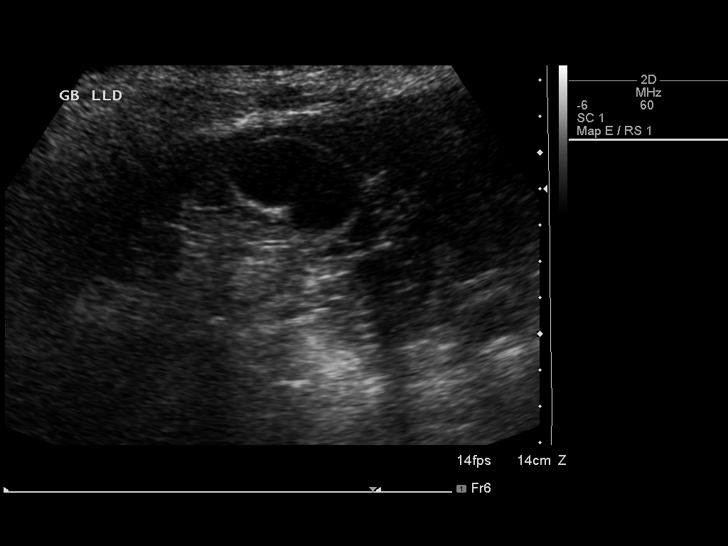
[im 59/71]
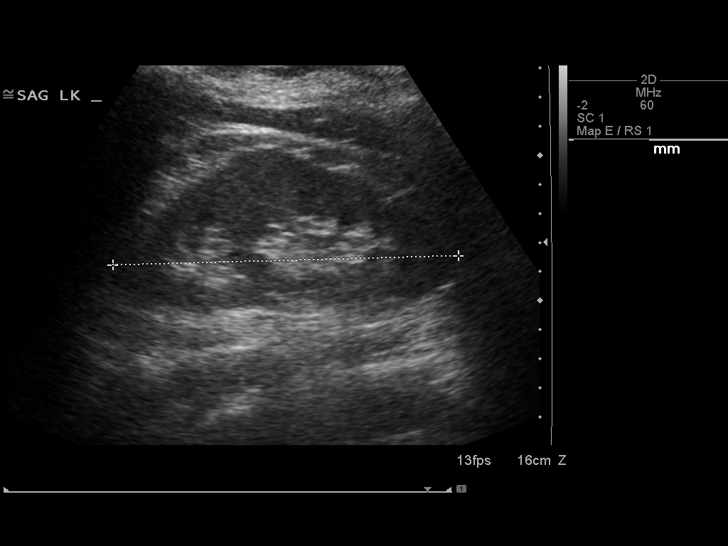
[im 65/71]
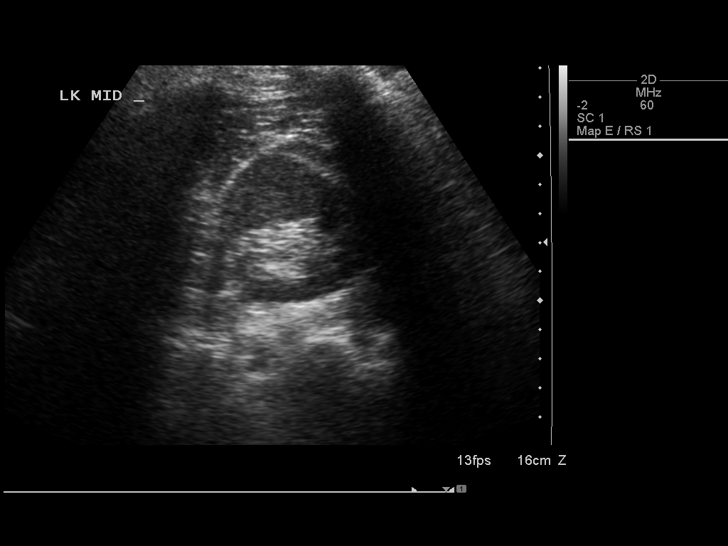
[im 71/71]
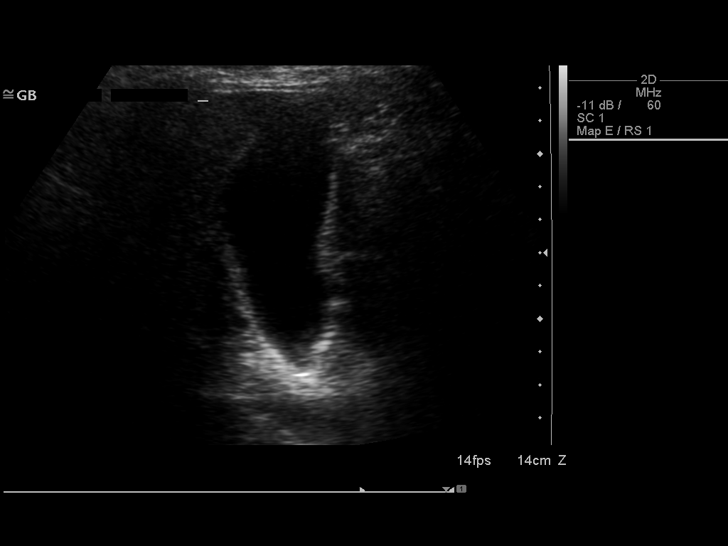

[14 of 25 positions shown; findings below may reference images not displayed]

FINDINGS: Gallbladder: No gallstones or wall thickening visualized. No
sonographic Murphy sign noted.

Common bile duct: Diameter: 3.2 mm

Liver: Liver is inhomogeneous suggesting fatty infiltration and/or
hepatocellular disease.

IVC: No abnormality visualized.

Pancreas: Visualized portion unremarkable.

Spleen: Size and appearance within normal limits.

Right Kidney: Length: 11.6 cm. Echogenicity within normal limits. No
mass or hydronephrosis visualized.

Left Kidney: Length: 12.2 cm. Echogenicity within normal limits. No
mass or hydronephrosis visualized.

Abdominal aorta: No aneurysm visualized.

Other findings: None.
IMPRESSION: Inhomogeneous hepatic echotexture suggesting fatty infiltration
and/or hepatocellular disease. Exam is otherwise negative.

## 2015-10-28 NOTE — L&D Delivery Note (Signed)
Delivery Note At 3:56 PM a viable female was delivered via Vaginal, Spontaneous Delivery (Presentation:OA ;  ).  [id number]: , ; weight  .   Placenta status:spont>>intact, loose nuchal X 1 , .  Cord:  with the following complications: .  Cord pH: not sent  Anesthesia: Epidural  Episiotomy:  none Lacerations:  First deg Suture Repair: 3.0 vicryl rapide Est. Blood Loss (mL):  250  Mom to postpartum.  Baby to Couplet care / Skin to Skin.  Meriel PicaHOLLAND,Macarthur Lorusso M 02/20/2016, 4:10 PM

## 2015-11-12 MED FILL — TERCONAZOLE 0.8% VAGINAL CR: 0.8 | 3 days supply | Qty: 20 | Fill #0

## 2015-11-26 DIAGNOSIS — R509 Fever, unspecified: Secondary | ICD-10-CM | POA: Diagnosis not present

## 2015-11-26 DIAGNOSIS — J069 Acute upper respiratory infection, unspecified: Secondary | ICD-10-CM | POA: Diagnosis not present

## 2015-11-26 DIAGNOSIS — R0981 Nasal congestion: Secondary | ICD-10-CM | POA: Diagnosis not present

## 2015-11-26 DIAGNOSIS — R51 Headache: Secondary | ICD-10-CM | POA: Diagnosis not present

## 2015-12-03 DIAGNOSIS — Z36 Encounter for antenatal screening of mother: Secondary | ICD-10-CM | POA: Diagnosis not present

## 2015-12-03 DIAGNOSIS — Z3400 Encounter for supervision of normal first pregnancy, unspecified trimester: Secondary | ICD-10-CM | POA: Diagnosis not present

## 2015-12-03 DIAGNOSIS — Z23 Encounter for immunization: Secondary | ICD-10-CM | POA: Diagnosis not present

## 2015-12-11 DIAGNOSIS — O99810 Abnormal glucose complicating pregnancy: Secondary | ICD-10-CM | POA: Diagnosis not present

## 2015-12-17 ENCOUNTER — Other Ambulatory Visit (HOSPITAL_COMMUNITY): Payer: Self-pay | Admitting: Obstetrics and Gynecology

## 2015-12-17 DIAGNOSIS — R87610 Atypical squamous cells of undetermined significance on cytologic smear of cervix (ASC-US): Secondary | ICD-10-CM | POA: Diagnosis not present

## 2015-12-20 ENCOUNTER — Encounter: Payer: 59 | Attending: Obstetrics and Gynecology | Admitting: *Deleted

## 2015-12-20 ENCOUNTER — Ambulatory Visit (HOSPITAL_COMMUNITY)
Admission: RE | Admit: 2015-12-20 | Discharge: 2015-12-20 | Disposition: A | Discharge status: Home / Self Care | Payer: 59 | Source: Ambulatory Visit | Attending: Obstetrics and Gynecology | Admitting: Obstetrics and Gynecology

## 2015-12-20 DIAGNOSIS — Z713 Dietary counseling and surveillance: Secondary | ICD-10-CM | POA: Diagnosis not present

## 2015-12-20 DIAGNOSIS — O24419 Gestational diabetes mellitus in pregnancy, unspecified control: Secondary | ICD-10-CM

## 2015-12-20 MED FILL — TRUEplus LANCETS 30G MISC: 25 days supply | Qty: 100 | Fill #0

## 2015-12-20 MED FILL — TRUE METRIX GLUCOSE TEST ST: 25 days supply | Qty: 100 | Fill #0

## 2015-12-20 NOTE — Progress Notes (Signed)
  Patient was seen on 12/20/15 for Gestational Diabetes self-management . [name] has a history of GDM with her last child two years ago. She was able to manage affectively with nutrition and exercise.  The following learning objectives were met by the patient :   States the definition of Gestational Diabetes  States why dietary management is important in controlling blood glucose  Describes the effects of carbohydrates on blood glucose levels  Demonstrates ability to create a balanced meal plan  Demonstrates carbohydrate counting   States when to check blood glucose levels  Demonstrates proper blood glucose monitoring techniques  States the effect of stress and exercise on blood glucose levels  States the importance of limiting caffeine and abstaining from alcohol and smoking  Plan:  Aim for 2 Carb Choices per meal (30 grams) +/- 1 either way for breakfast Aim for 3 Carb Choices per meal (45 grams) +/- 1 either way from lunch and dinner Aim for 1-2 Carbs per snack Begin reading food labels for Total Carbohydrate and sugar grams of foods Consider  increasing your activity level by walking daily as tolerated Begin checking BG before breakfast and 2 hours after first bit of breakfast, lunch and dinner after  as directed by MD  Take medication  as directed by MD  Blood glucose monitor given: Order requested for True Matrix Meter and testing supplies to be sent to [facility]. [facility] Care Management has been advised that education has been completed.   Patient instructed to monitor glucose levels: FBS: 60 - <90 2 hour: <120  Patient received the following handouts:  Nutrition Diabetes and Pregnancy  Carbohydrate Counting List  Meal Planning worksheet  Patient will be seen for follow-up as needed.

## 2015-12-21 ENCOUNTER — Other Ambulatory Visit (HOSPITAL_COMMUNITY): Payer: Self-pay

## 2016-01-03 MED FILL — TRUE METRIX GLUCOSE TEST ST: 12 days supply | Qty: 50 | Fill #1

## 2016-01-14 ENCOUNTER — Ambulatory Visit: Payer: Self-pay | Admitting: *Deleted

## 2016-01-14 MED FILL — AMOXICILLIN 500 MG CAPSULE: 500 | 5 days supply | Qty: 10 | Fill #0

## 2016-01-17 ENCOUNTER — Ambulatory Visit (HOSPITAL_COMMUNITY): Payer: 59

## 2016-01-19 ENCOUNTER — Inpatient Hospital Stay (HOSPITAL_COMMUNITY)
Admission: EM | Admit: 2016-01-19 | Discharge: 2016-01-19 | Disposition: A | Discharge status: Home / Self Care | Payer: 59 | Source: Ambulatory Visit | Attending: Obstetrics and Gynecology | Admitting: Obstetrics and Gynecology

## 2016-01-19 ENCOUNTER — Encounter (HOSPITAL_COMMUNITY): Payer: Self-pay

## 2016-01-19 DIAGNOSIS — K219 Gastro-esophageal reflux disease without esophagitis: Secondary | ICD-10-CM | POA: Insufficient documentation

## 2016-01-19 DIAGNOSIS — K529 Noninfective gastroenteritis and colitis, unspecified: Secondary | ICD-10-CM | POA: Diagnosis not present

## 2016-01-19 DIAGNOSIS — O99613 Diseases of the digestive system complicating pregnancy, third trimester: Secondary | ICD-10-CM | POA: Insufficient documentation

## 2016-01-19 DIAGNOSIS — Z3A33 33 weeks gestation of pregnancy: Secondary | ICD-10-CM | POA: Diagnosis not present

## 2016-01-19 DIAGNOSIS — R197 Diarrhea, unspecified: Secondary | ICD-10-CM | POA: Diagnosis present

## 2016-01-19 LAB — URINALYSIS, ROUTINE W REFLEX MICROSCOPIC
BILIRUBIN URINE: NEGATIVE
GLUCOSE, UA: 500 mg/dL — AB
Hgb urine dipstick: NEGATIVE
Leukocytes, UA: NEGATIVE
Nitrite: NEGATIVE
PH: 5.5 (ref 5.0–8.0)
Protein, ur: NEGATIVE mg/dL
SPECIFIC GRAVITY, URINE: 1.025 (ref 1.005–1.030)

## 2016-01-19 MED ORDER — ONDANSETRON 4 MG PO TBDP: 4.0000 mg | ORAL_TABLET | Freq: Three times a day (TID) | ORAL | Status: DC | PRN

## 2016-01-19 MED ORDER — DEXTROSE 5 % IN LACTATED RINGERS IV BOLUS
1000.0000 mL | Freq: Once | INTRAVENOUS | Status: AC
  Administered 2016-01-19: 1000 mL via INTRAVENOUS

## 2016-01-19 MED ORDER — LACTATED RINGERS IV BOLUS (SEPSIS)
1000.0000 mL | Freq: Once | INTRAVENOUS | Status: AC
  Administered 2016-01-19: 1000 mL via INTRAVENOUS

## 2016-01-19 MED ORDER — METOCLOPRAMIDE HCL 5 MG/ML IJ SOLN
10.0000 mg | Freq: Once | INTRAMUSCULAR | Status: AC
  Administered 2016-01-19: 10 mg via INTRAVENOUS
  Filled 2016-01-19: qty 2

## 2016-01-19 MED ORDER — FAMOTIDINE IN NACL 20-0.9 MG/50ML-% IV SOLN
20.0000 mg | Freq: Once | INTRAVENOUS | Status: AC
  Administered 2016-01-19: 20 mg via INTRAVENOUS
  Filled 2016-01-19: qty 50

## 2016-01-19 MED ORDER — ONDANSETRON HCL 4 MG/2ML IJ SOLN
4.0000 mg | Freq: Once | INTRAMUSCULAR | Status: AC
  Administered 2016-01-19: 4 mg via INTRAVENOUS
  Filled 2016-01-19: qty 2

## 2016-01-19 NOTE — MAU Note (Signed)
Nausea yesterday. Vomiting and diarrhea started in the evening. Just finished Amoxicillin for sinus infection. Feel better when I am still.

## 2016-01-19 NOTE — Discharge Instructions (Signed)
Norovirus Infection °A norovirus infection is caused by exposure to a virus in a group of similar viruses (noroviruses). This type of infection causes inflammation in your stomach and intestines (gastroenteritis). Norovirus is the most common cause of gastroenteritis. It also causes food poisoning. °Anyone can get a norovirus infection. It spreads very easily (contagious). You can get it from contaminated food, water, surfaces, or other people. Norovirus is found in the stool or vomit of infected people. You can spread the infection as soon as you feel sick until 2 weeks after you recover.  °Symptoms usually begin within 2 days after you become infected. Most norovirus symptoms affect the digestive system. °CAUSES °Norovirus infection is caused by contact with norovirus. You can catch norovirus if you: °· Eat or drink something contaminated with norovirus. °· Touch surfaces or objects contaminated with norovirus and then put your hand in your mouth. °· Have direct contact with an infected person who has symptoms. °· Share food, drink, or utensils with someone with who is sick with norovirus. °SIGNS AND SYMPTOMS °Symptoms of norovirus may include: °· Nausea. °· Vomiting. °· Diarrhea. °· Stomach cramps. °· Fever. °· Chills. °· Headache. °· Muscle aches. °· Tiredness. °DIAGNOSIS °Your health care provider may suspect norovirus based on your symptoms and physical exam. Your health care provider may also test a sample of your stool or vomit for the virus.  °TREATMENT °There is no specific treatment for norovirus. Most people get better without treatment in about 2 days. °HOME CARE INSTRUCTIONS °· Replace lost fluids by drinking plenty of water or rehydration fluids containing important minerals called electrolytes. This prevents dehydration. Drink enough fluid to keep your urine clear or pale yellow. °· Do not prepare food for others while you are infected. Wait at least 3 days after recovering from the illness to do  that. °PREVENTION  °· Wash your hands often, especially after using the toilet or changing a diaper. °· Wash fruits and vegetables thoroughly before preparing or serving them. °· Throw out any food that a sick person may have touched. °· Disinfect contaminated surfaces immediately after someone in the household has been sick. Use a bleach-based household cleaner. °· Immediately remove and wash soiled clothes or sheets. °SEEK MEDICAL CARE IF: °· Your vomiting, diarrhea, and stomach pain is getting worse. °· Your symptoms of norovirus do not go away after 2-3 days. °SEEK IMMEDIATE MEDICAL CARE IF:  °You develop symptoms of dehydration that do not improve with fluid replacement. This may include: °· Excessive sleepiness. °· Lack of tears. °· Dry mouth. °· Dizziness when standing. °· Weak pulse. °  °This information is not intended to replace advice given to you by your health care provider. Make sure you discuss any questions you have with your health care provider. °  °Document Released: 01/03/2003 Document Revised: 11/03/2014 Document Reviewed: 03/23/2014 °Elsevier Interactive Patient Education ©2016 Elsevier Inc. ° °

## 2016-01-19 NOTE — MAU Provider Note (Signed)
History     CSN: [id number]  Arrival date and time: 01/19/16 0243   None     Chief Complaint  Patient presents with  . Emesis During Pregnancy  . Diarrhea   HPI   Ms.[name] is a 30 y.o. female [phone] @ [redacted]w[redacted]d presenting to MAU with nausea, vomiting, diarrhea.  She has vomited 5 times since it started, and 4 episodes of diarrhea. She was vomiting and having diarrhea at the same time at home.   Patient ate some ham yesterday and was unsure if this caused the symptoms. Patient just finished an antibiotic for sinus infection.   + fetal movement.   OB History    Gravida Para Term Preterm AB TAB SAB Ectopic Multiple Living   Past Medical History  Diagnosis Date  . Anxiety   . Acid reflux   . Infertility management   . Gestational diabetes   . Classic migraine 09/12/2014    Past Surgical History  Procedure Laterality Date  . Tonsillectomy    . Dilation and curettage of uterus      Family History  Problem Relation Age of Onset  . Diabetes Mother   . Cervical cancer Mother   . Diabetes Father   . Heart disease Father   . Diabetes Maternal Grandmother   . Migraines Daughter   . Colon polyps Mother   . Liver disease Father   . Kidney disease Maternal Grandmother     Social History  Substance Use Topics  . Smoking status: Never Smoker   . Smokeless tobacco: Never Used  . Alcohol Use: No    Allergies:  Allergies  Allergen Reactions  . Cephalosporins     Had an allergy test and was told was allergic  . Latex Other (See Comments)    Rash, swelling itching  . Phenergan [Promethazine Hcl] Anxiety    Received IV in past, high anxiety, prefers not to take again.    Prescriptions prior to admission  Medication Sig Dispense Refill Last Dose  . acetaminophen (TYLENOL) 325 MG tablet Take 325 mg by mouth as needed for fever.    Past Week at Unknown time  . calcium carbonate (TUMS - DOSED IN MG ELEMENTAL CALCIUM) 500 MG chewable  tablet Chew 2 tablets by mouth daily.   01/18/2016 at Unknown time  . esomeprazole (NEXIUM) 20 MG capsule Take 20 mg by mouth daily at 12 noon.   01/18/2016 at Unknown time  . Prenatal Vit-Fe Fumarate-FA (PRENATAL MULTIVITAMIN) TABS tablet Take 1 tablet by mouth daily at 12 noon.   Past Week at Unknown time  . Doxylamine-Pyridoxine (DICLEGIS) 10-10 MG TBEC Take 1 tablet by mouth daily as needed (nausea and vomitting).   Past Week at Unknown time  . famotidine (PEPCID) 40 MG tablet Take 1 tablet (40 mg total) by mouth 2 (two) times daily. (Patient taking differently: Take 40 mg by mouth daily. ) 60 tablet 6 Past Week at Unknown time  . ondansetron (ZOFRAN) 4 MG tablet Take 1 tablet (4 mg total) by mouth every 6 (six) hours. 12 tablet 0   . PROGESTERONE VA Place 1 suppository vaginally 2 (two) times daily.   08/18/2015 at Unknown time  . promethazine (PHENERGAN) 25 MG tablet Take 0.5-1 tablets (12.5-25 mg total) by mouth every 6 (six) hours as needed. 30 tablet 0 Unknown at Unknown time   No results found for this or any  previous visit (from the past 48 hour(s)).   Results for orders placed or performed during the hospital encounter of 01/19/16 (from the past 72 hour(s))  Urinalysis, Routine w reflex microscopic ([facility])     Status: Abnormal   Collection Time: 01/19/16  4:50 AM  Result Value Ref Range   Color, Urine YELLOW YELLOW   APPearance CLEAR CLEAR   Specific Gravity, Urine 1.025 1.005 - 1.030   pH 5.5 5.0 - 8.0   Glucose, UA 500 (A) NEGATIVE mg/dL   Hgb urine dipstick NEGATIVE NEGATIVE   Bilirubin Urine NEGATIVE NEGATIVE   Ketones, ur >80 (A) NEGATIVE mg/dL   Protein, ur NEGATIVE NEGATIVE mg/dL   Nitrite NEGATIVE NEGATIVE   Leukocytes, UA NEGATIVE NEGATIVE    Comment: MICROSCOPIC NOT DONE ON URINES WITH NEGATIVE PROTEIN, BLOOD, LEUKOCYTES, NITRITE, OR GLUCOSE <1000 mg/dL.    Review of Systems  Constitutional: Negative for fever.  Gastrointestinal: Positive for heartburn,  nausea, vomiting and diarrhea. Negative for abdominal pain.  Genitourinary: Negative for dysuria.   Physical Exam   Blood pressure 127/77, pulse 115, temperature 98 F (36.7 C), temperature source Oral, resp. rate 20, last menstrual period 06/01/2015, currently breastfeeding.  Physical Exam  Constitutional: She is oriented to person, place, and time. She appears well-developed and well-nourished. No distress.  HENT:  Head: Normocephalic.  GI: Soft. Normal appearance. There is tenderness in the epigastric area. There is no rigidity, no rebound and no guarding.  Musculoskeletal: Normal range of motion.  Neurological: She is alert and oriented to person, place, and time.  Skin: She is not diaphoretic.  Psychiatric: Her behavior is normal.    Fetal Tracing: Baseline: 150 bpm Variability: Moderate  Accelerations: 15x15 Decelerations: none Toco: Initially contractions every 3-4 mins; fluids started and contractions subsided.    MAU Course  Procedures  None  MDM  Zofran 4 mg LR bolus D5LR Reglan 10 mg IV Pepcid Discussed patient with Dr. Henderson Cloudomblin.  Po challenge: pass    Assessment and Plan   A:  1. Gastroenteritis   2. Gastroesophageal reflux disease, esophagitis presence not specified     P:  Discharge home in stable condition RX: Zofran  BRAT diet Increase PO fluid intake.  Follow up with Dr. Henderson Cloudomblin as scheduled Return to MAU if symptoms were to worsen.    Duane LopeJennifer I Cornelius Schuitema, NP. 01/21/2016 9:28 AM

## 2016-01-28 ENCOUNTER — Encounter: Payer: Self-pay | Admitting: *Deleted

## 2016-01-28 ENCOUNTER — Other Ambulatory Visit: Payer: Self-pay | Admitting: *Deleted

## 2016-01-28 NOTE — Patient Outreach (Signed)
[facility]) Care Management   01/28/2016  [name] Dec 18, 1985 [id number]  [name] is an 30 y.o. female who presents to the [facility] [facility] office to enroll in the Link To Wellness program for self management assistance with gestational diabetes.  Subjective:  [name] says she was referred to the [facility] To Wellness program by the [name] when she met with her in February for Healthy Pregnancy counseling. She says she has gestational diabetes with her second child and was "borderline" with her first child. 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 Her husband works in [place] at [employer].   Objective:   Review of Systems  Constitutional: Negative.     Physical Exam  Constitutional: She is oriented to person, place, and time. She appears well-developed and well-nourished.  Cardiovascular: Normal rate and regular rhythm.   Respiratory: Effort normal.  Neurological: She is alert and oriented to person, place, and time.  Skin: Skin is warm and dry.  Psychiatric: She has a normal mood and affect. Her behavior is normal. Judgment and thought content normal.   Filed Weights   01/28/16 1305  Weight: 241 lb 6.4 oz (109.498 kg)   Filed Vitals:   01/28/16 1305  BP: 115/70  Pulse: 98    Encounter Medications:   Outpatient Encounter Prescriptions as of 01/28/2016  Medication Sig  . acetaminophen (TYLENOL) 325 MG tablet Take 325 mg by mouth as needed for fever.   . calcium carbonate (TUMS - DOSED IN MG ELEMENTAL CALCIUM) 500 MG chewable tablet Chew 2 tablets by mouth daily.  Marland Kitchen esomeprazole (NEXIUM) 20 MG capsule Take 20 mg by mouth daily at 12 noon.  . Prenatal Vit-Fe Fumarate-FA (PRENATAL MULTIVITAMIN) TABS tablet Take 1 tablet by mouth daily at 12 noon.  . Doxylamine-Pyridoxine (DICLEGIS) 10-10 MG TBEC Take 1 tablet by mouth daily as needed (nausea and vomitting). Reported on 01/28/2016  .  ondansetron (ZOFRAN ODT) 4 MG disintegrating tablet Take 1 tablet (4 mg total) by mouth every 8 (eight) hours as needed for nausea or vomiting. (Patient not taking: Reported on 01/28/2016)  . PROGESTERONE VA Place 1 suppository vaginally 2 (two) times daily.   No facility-administered encounter medications on file as of 01/28/2016.    Functional Status:   In your present state of health, do you have any difficulty performing the following activities: 01/28/2016 01/19/2016  Hearing? N N  Vision? N N  Difficulty concentrating or making decisions? N N  Walking or climbing stairs? N N  Dressing or bathing? N N  Doing errands, shopping? N -    Fall/Depression Screening:    PHQ 2/9 Scores 01/28/2016 06/24/2015  PHQ - 2 Score 0 0    Assessment:   Spouse of [employer] employee enrolling in the Link To Wellness program for self management assistance with gestational diabetes.  Plan:  [facility] CM Care Plan Problem One        Most Recent Value   Care Plan Problem One  Gestational diabetes currently with good blood sugar control as evidenced by >75% of all blood sugars meeting targets of 60-90 fasting and <120 post meal   Role Documenting the Problem One  Care Management [place] for Problem One  Active   [facility]N Long Term Goal (31-90 days)  Ongoing good control of blood sugars as evidenced by >75% of readings meeting targets and delivery of healthy baby at term around  03/07/16   THN Long Term Goal Start Date  01/28/16   Interventions for Problem One Long Term Goal   Discussed Link to Wellness program goals, requirements and benefits, reviewed member's rights and responsibilities ,provided gestational diabetes information with explanation , ensured member agreed and signed consent to participate and authorization to release and receive health information, consent, participation agreement and consent to enroll in program, assessed member's current knowledge of gestational diabetes, assigned Emmi education  modules related to gestational diabetes, encouraged patient to keep post partum MD visits to ensure diabetes resolves with childbirth, discussed role of obesity/being overweight, especially abdominal (visceral) obesity, on insulin resistance , encouraged patient to lose weight to normal BMI post delivery to reduce risks of developing Type II DM, reviewed patient's medications and assessed medication adherence ,discussed effects of physical activity on glucose levels and long-term glucose control by improving insulin sensitivity and assisting with weight management and cardiovascular health,encouraged patient to continue with walking exercise routine,discussed exercise opportunities offered for free by [facility], ensured patient testing supplies at no cost, reviewed patient's CBG log, discussed blood glucose monitoring and interpretation, reviewed target ranges for pre-meal and post-meal, discussed the role of stress on blood sugar and strategies to reduce stress related to alcoholic father, discussed results of  Hgb A1C done on 02/13/15, Hgb A1C goal, and correlation to estimated average glucose, provided information on: prevention, and detection  of Type II DM, and advised patient she is more at risk to develop Type II DM due to gestational diabetes, obesity and family history, advised patient this RNCM will contact her in late June or early July to assess status  of blood sugar and to arrange for Link To Wellness follow up if necessary      RNCM to fax today's office visit note to [name] and [name]. RNCM will meet as needed with patient per Link To Wellness program guidelines to assist with gestational diabetes self-management and assess patient's progress toward mutually set goals.  Barrington Ellison RN,CCM,CDE [place] Management Coordinator Link To Wellness Office Phone [phone] Office Fax [phone]

## 2016-01-31 MED FILL — TRUE METRIX GLUCOSE TEST ST: 25 days supply | Qty: 100 | Fill #2

## 2016-02-05 IMAGING — US US OB TRANSVAGINAL
1 series · 15 of 28 positions shown · non-contrast
Comparison: None.

CLINICAL DATA: Nausea, vomiting, abdominal pain for 2 days. Patient
is 6 weeks 6 days by LMP. EDC by LMP is 03/07/2016. LMP 06/01/2015.

EXAM:
OBSTETRIC <14 WK US AND TRANSVAGINAL OB US
TECHNIQUE: Both transabdominal and transvaginal ultrasound examinations were
performed for complete evaluation of the gestation as well as the
maternal uterus, adnexal regions, and pelvic cul-de-sac.
Transvaginal technique was performed to assess early pregnancy.

[Series 1: us ob transvaginal · 15 of 41 slices shown]
[im 1/41]
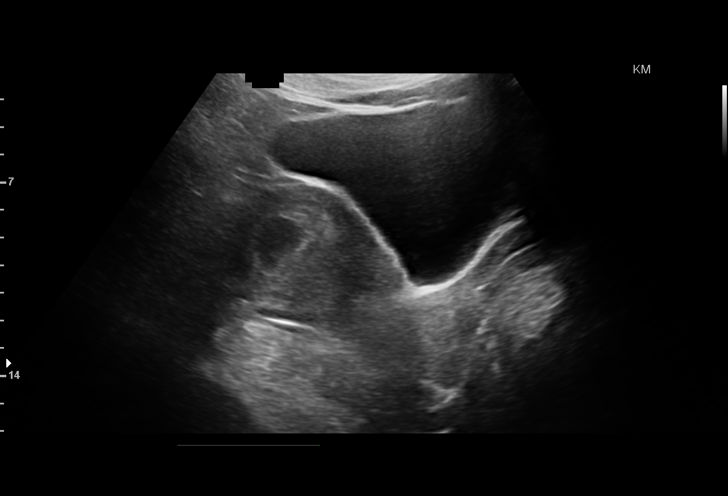
[im 3/41]
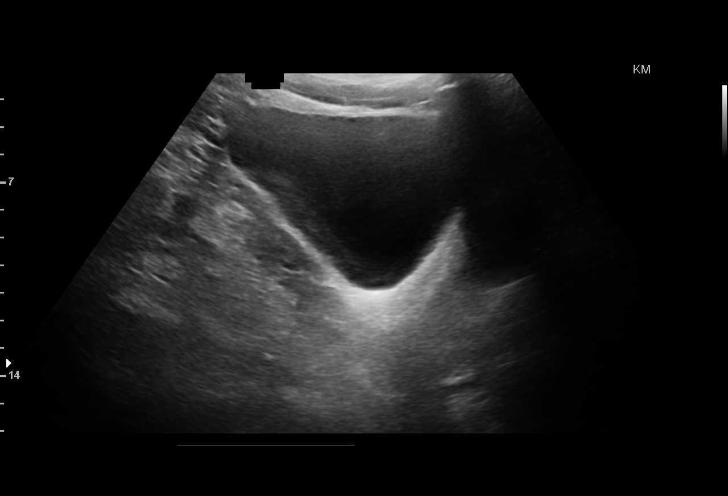
[im 6/41]
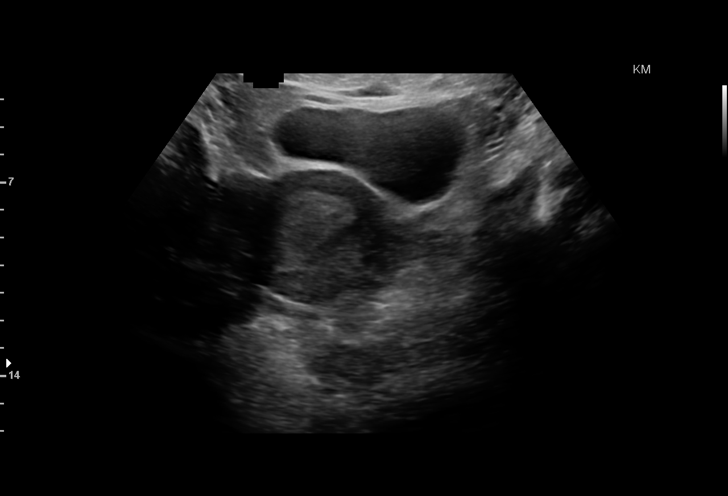
[im 9/41]
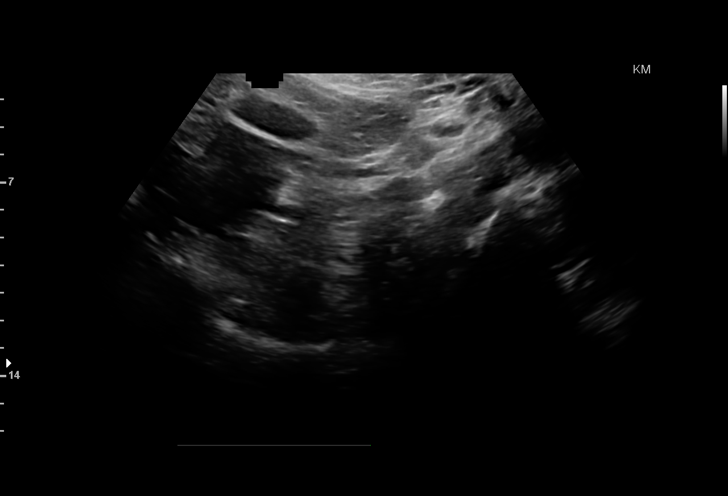
[im 12/41]
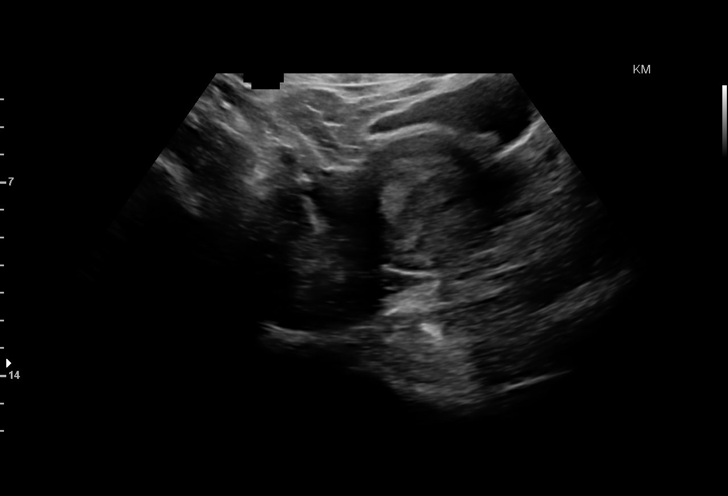
[im 15/41]
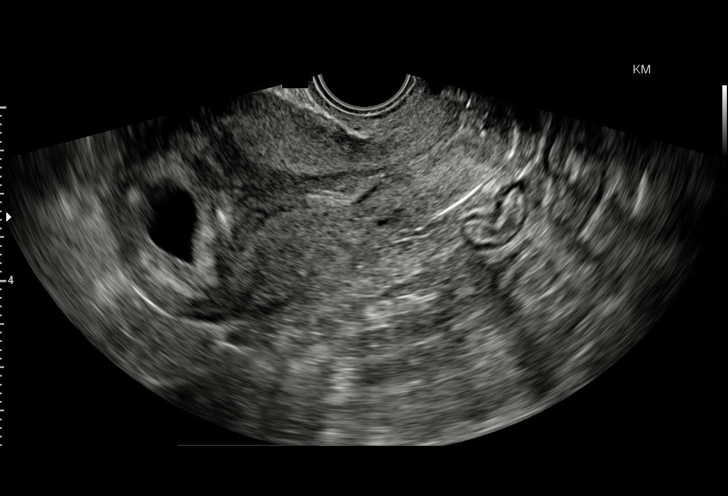
[im 18/41]
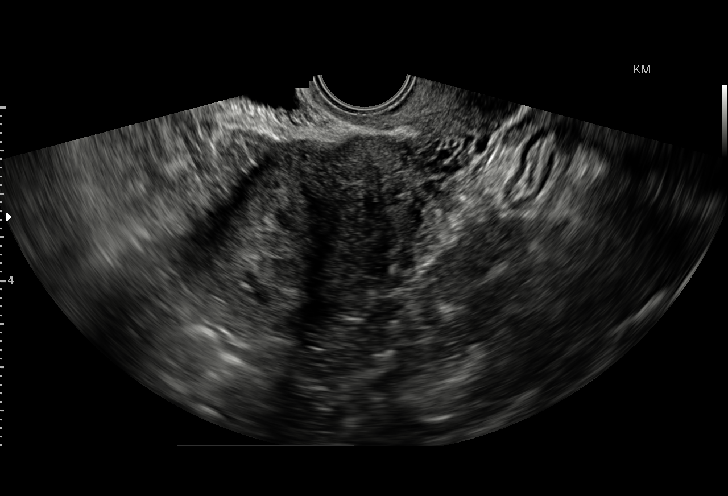
[im 21/41]
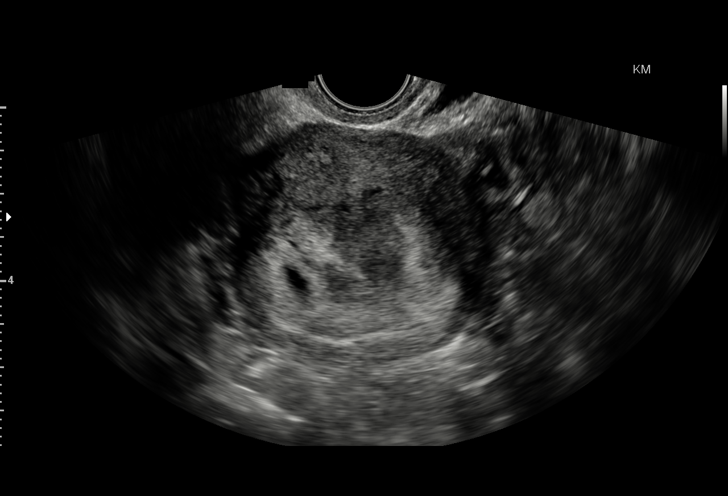
[im 23/41]
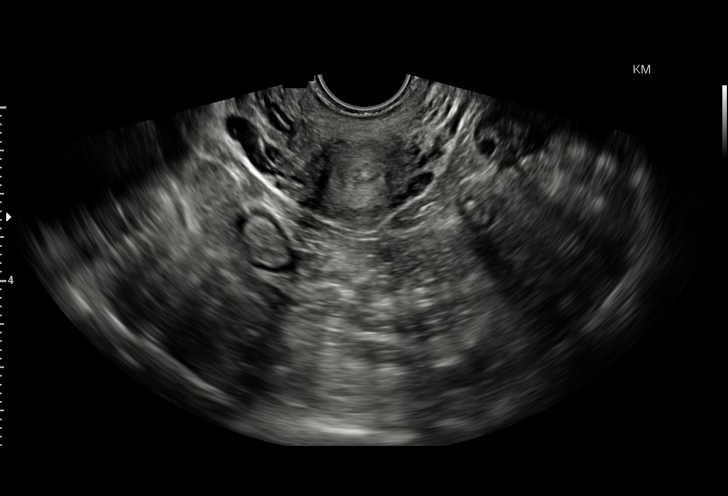
[im 26/41]
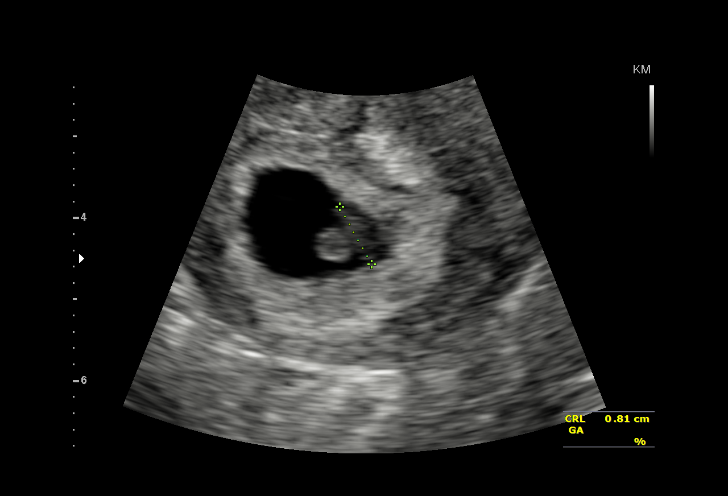
[im 29/41]
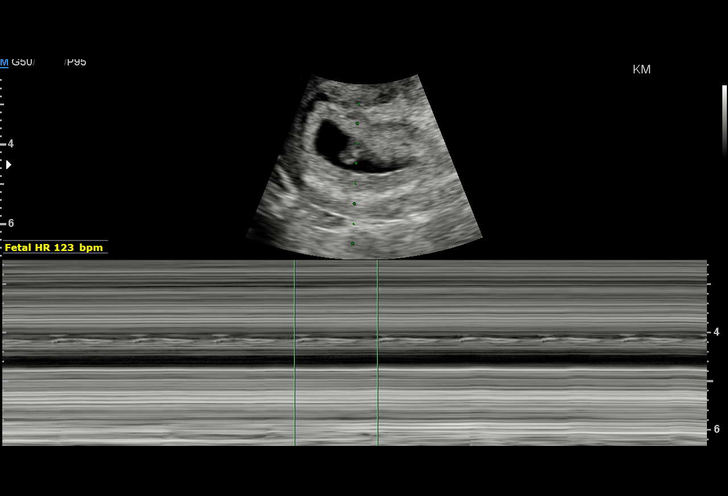
[im 32/41]
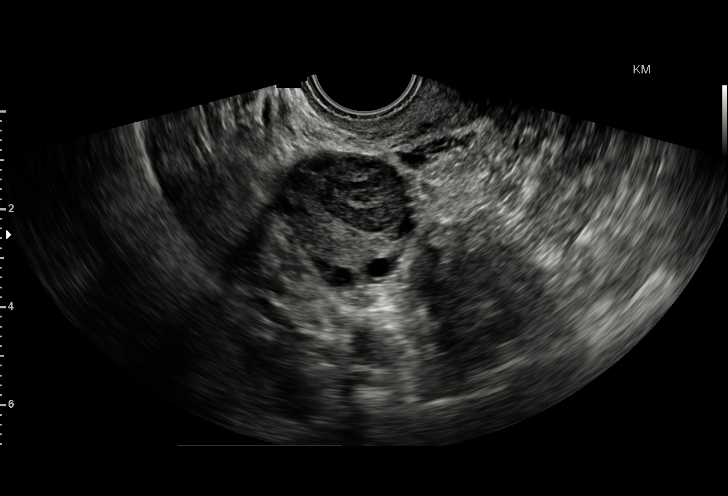
[im 35/41]
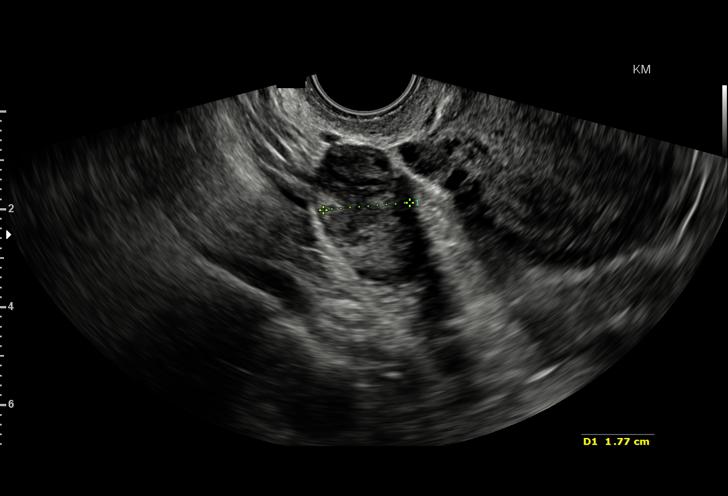
[im 38/41]
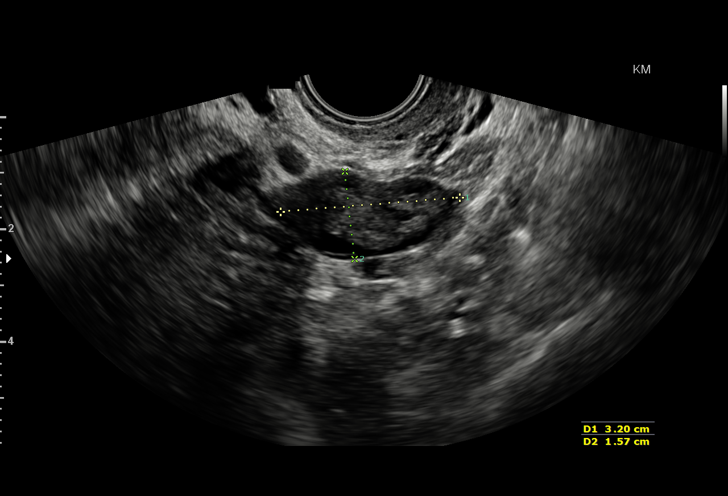
[im 41/41]
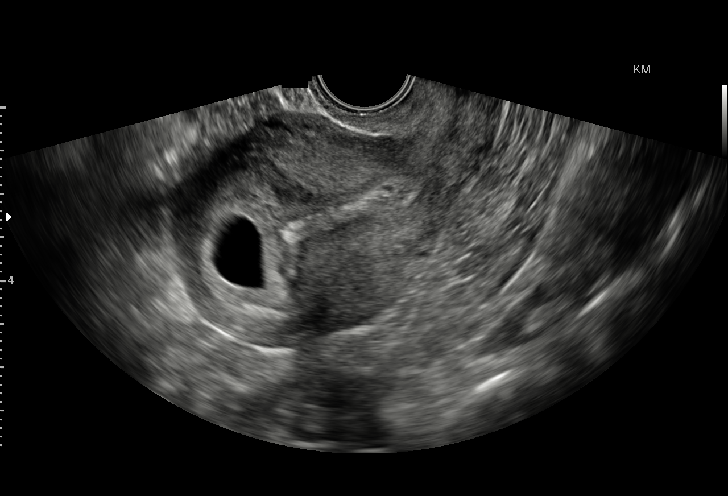

[15 of 28 positions shown; findings below may reference images not displayed]

FINDINGS: Intrauterine gestational sac: Visualized/normal in shape.

Yolk sac:  Present

Embryo:  Present

Cardiac Activity: Present

Heart Rate: 123  bpm

CRL:  8.3  mm   6 w   5 d                  US EDC: 03/09/2016

Maternal uterus/adnexae: No subchorionic hemorrhage. Small right
corpus luteum cyst. Left ovary has a normal appearance. No free
pelvic fluid.
IMPRESSION: 1. Single living intrauterine embryo.
2. Size by ultrasound confirms clinical EDC of 03/07/2016.

## 2016-02-11 DIAGNOSIS — R030 Elevated blood-pressure reading, without diagnosis of hypertension: Secondary | ICD-10-CM | POA: Diagnosis not present

## 2016-02-11 DIAGNOSIS — Z36 Encounter for antenatal screening of mother: Secondary | ICD-10-CM | POA: Diagnosis not present

## 2016-02-11 DIAGNOSIS — Z3493 Encounter for supervision of normal pregnancy, unspecified, third trimester: Secondary | ICD-10-CM | POA: Diagnosis not present

## 2016-02-13 DIAGNOSIS — Z3A36 36 weeks gestation of pregnancy: Secondary | ICD-10-CM | POA: Diagnosis not present

## 2016-02-13 DIAGNOSIS — Z3400 Encounter for supervision of normal first pregnancy, unspecified trimester: Secondary | ICD-10-CM | POA: Diagnosis not present

## 2016-02-13 DIAGNOSIS — O24410 Gestational diabetes mellitus in pregnancy, diet controlled: Secondary | ICD-10-CM | POA: Diagnosis not present

## 2016-02-15 LAB — OB RESULTS CONSOLE GBS: STREP GROUP B AG: POSITIVE

## 2016-02-17 ENCOUNTER — Inpatient Hospital Stay (EMERGENCY_DEPARTMENT_HOSPITAL)
Admission: AD | Admit: 2016-02-17 | Discharge: 2016-02-18 | Disposition: A | Discharge status: Home / Self Care | Payer: 59 | Source: Ambulatory Visit | Attending: Obstetrics and Gynecology | Admitting: Obstetrics and Gynecology

## 2016-02-17 ENCOUNTER — Encounter (HOSPITAL_COMMUNITY): Payer: Self-pay | Admitting: *Deleted

## 2016-02-17 DIAGNOSIS — Z3A37 37 weeks gestation of pregnancy: Secondary | ICD-10-CM

## 2016-02-17 DIAGNOSIS — O163 Unspecified maternal hypertension, third trimester: Secondary | ICD-10-CM

## 2016-02-17 DIAGNOSIS — O133 Gestational [pregnancy-induced] hypertension without significant proteinuria, third trimester: Secondary | ICD-10-CM

## 2016-02-17 DIAGNOSIS — O24410 Gestational diabetes mellitus in pregnancy, diet controlled: Secondary | ICD-10-CM | POA: Insufficient documentation

## 2016-02-17 DIAGNOSIS — R030 Elevated blood-pressure reading, without diagnosis of hypertension: Secondary | ICD-10-CM

## 2016-02-17 LAB — CBC
HCT: 33.4 % — ABNORMAL LOW (ref 36.0–46.0)
Hemoglobin: 10.9 g/dL — ABNORMAL LOW (ref 12.0–15.0)
MCH: 26.3 pg (ref 26.0–34.0)
MCHC: 32.6 g/dL (ref 30.0–36.0)
MCV: 80.5 fL (ref 78.0–100.0)
PLATELETS: 240 10*3/uL (ref 150–400)
RBC: 4.15 MIL/uL (ref 3.87–5.11)
RDW: 15.2 % (ref 11.5–15.5)
WBC: 12 10*3/uL — ABNORMAL HIGH (ref 4.0–10.5)

## 2016-02-17 LAB — COMPREHENSIVE METABOLIC PANEL
ALK PHOS: 124 U/L (ref 38–126)
ALT: 14 U/L (ref 14–54)
AST: 16 U/L (ref 15–41)
Albumin: 2.7 g/dL — ABNORMAL LOW (ref 3.5–5.0)
Anion gap: 5 (ref 5–15)
BUN: 8 mg/dL (ref 6–20)
CO2: 23 mmol/L (ref 22–32)
CREATININE: 0.5 mg/dL (ref 0.44–1.00)
Calcium: 8.6 mg/dL — ABNORMAL LOW (ref 8.9–10.3)
Chloride: 108 mmol/L (ref 101–111)
GFR calc Af Amer: >60 mL/min (ref >60)
GFR calc non Af Amer: >60 mL/min (ref >60)
Glucose, Bld: 83 mg/dL (ref 65–99)
Potassium: 3.7 mmol/L (ref 3.5–5.1)
SODIUM: 136 mmol/L (ref 135–145)
Total Bilirubin: 0.7 mg/dL (ref 0.3–1.2)
Total Protein: 5.7 g/dL — ABNORMAL LOW (ref 6.5–8.1)

## 2016-02-17 NOTE — MAU Note (Signed)
Pt c/o pelvic pressure and back pain. Some irregular contractions. Has had some elevated BPs in office that they have been watching. Denies LOF, vag bleeding. +FM. Was 2cm on Monday.

## 2016-02-17 NOTE — MAU Provider Note (Signed)
History     CSN: [id number]  Arrival date and time: 02/17/16 2150   First Provider Initiated Contact with Patient 02/17/16 2249      Chief Complaint  Patient presents with  . Labor Eval   HPI Comments: [name] is a 30 y.o. [phone] at [redacted]w[redacted]d who presents today for labor evaluation. She was noted to have elevated blood pressure. She states that she had her first child at 36 weeks due to a combination of elevated blood pressure, and her water breaking. She states that she has been seen 2X week because her blood pressure has been increasing. She has an appointment tomorrow. She reports a headache at this time. She denies any RUQ, vaginal bleeding or LOF. She states that the fetus has been moving normally.    OB History    Gravida Para Term Preterm AB TAB SAB Ectopic Multiple Living   Past Medical History  Diagnosis Date  . Anxiety   . Acid reflux   . Infertility management   . Gestational diabetes   . Classic migraine 09/12/2014    Past Surgical History  Procedure Laterality Date  . Tonsillectomy    . Dilation and curettage of uterus      Family History  Problem Relation Age of Onset  . Diabetes Mother   . Cervical cancer Mother   . Diabetes Father   . Heart disease Father   . Diabetes Maternal Grandmother   . Migraines Daughter   . Colon polyps Mother   . Liver disease Father   . Kidney disease Maternal Grandmother     Social History  Substance Use Topics  . Smoking status: Never Smoker   . Smokeless tobacco: Never Used  . Alcohol Use: No    Allergies:  Allergies  Allergen Reactions  . Cephalosporins     Had an allergy test and was told was allergic  . Latex Other (See Comments)    Rash, swelling itching  . Phenergan [Promethazine Hcl] Anxiety    Received IV in past, high anxiety, prefers not to take again.    Prescriptions prior to admission  Medication Sig Dispense Refill Last Dose  . esomeprazole (NEXIUM) 20 MG  capsule Take 20 mg by mouth daily at 12 noon.   02/17/2016 at Unknown time  . Prenatal Vit-Fe Fumarate-FA (PRENATAL MULTIVITAMIN) TABS tablet Take 1 tablet by mouth daily at 12 noon.   Past Week at Unknown time  . acetaminophen (TYLENOL) 325 MG tablet Take 325 mg by mouth as needed for fever.    More than a month at Unknown time  . calcium carbonate (TUMS - DOSED IN MG ELEMENTAL CALCIUM) 500 MG chewable tablet Chew 2 tablets by mouth daily.   More than a month at Unknown time  . Doxylamine-Pyridoxine (DICLEGIS) 10-10 MG TBEC Take 1 tablet by mouth daily as needed (nausea and vomitting). Reported on 01/28/2016   More than a month at Unknown time  . ondansetron (ZOFRAN ODT) 4 MG disintegrating tablet Take 1 tablet (4 mg total) by mouth every 8 (eight) hours as needed for nausea or vomiting. (Patient not taking: Reported on 01/28/2016) 20 tablet 0 Unknown at Unknown time  . PROGESTERONE VA Place 1 suppository vaginally 2 (two) times daily.   More than a month at Unknown time    Review of Systems  Constitutional: Negative for fever and chills.  Eyes: Positive for blurred vision.  Gastrointestinal: Negative for nausea, vomiting, abdominal pain, diarrhea and constipation.  Neurological: Positive for headaches.   Physical Exam   Blood pressure 142/92, pulse 94, temperature 97.9 F (36.6 C), temperature source Oral, resp. rate 18, height 5\' 5"  (1.651 m), weight 110.406 kg (243 lb 6.4 oz), last menstrual period 06/01/2015, SpO2 100 %, currently breastfeeding.  Physical Exam  Nursing note and vitals reviewed. Constitutional: She is oriented to person, place, and time. She appears well-developed and well-nourished. No distress.  HENT:  Head: Normocephalic.  Cardiovascular: Normal rate.   Respiratory: Effort normal. No respiratory distress.  GI: Soft. There is no tenderness. There is no rebound.  Neurological: She is alert and oriented to person, place, and time. She has normal reflexes.  No clonus    Skin: Skin is warm and dry.   Results for orders placed or performed during the hospital encounter of 02/17/16 (from the past 24 hour(s))  Comprehensive metabolic panel     Status: Abnormal   Collection Time: 02/17/16 11:02 PM  Result Value Ref Range   Sodium 136 135 - 145 mmol/L   Potassium 3.7 3.5 - 5.1 mmol/L   Chloride 108 101 - 111 mmol/L   CO2 23 22 - 32 mmol/L   Glucose, Bld 83 65 - 99 mg/dL   BUN 8 6 - 20 mg/dL   Creatinine, Ser 1.610.50 0.44 - 1.00 mg/dL   Calcium 8.6 (L) 8.9 - 10.3 mg/dL   Total Protein 5.7 (L) 6.5 - 8.1 g/dL   Albumin 2.7 (L) 3.5 - 5.0 g/dL   AST 16 15 - 41 U/L   ALT 14 14 - 54 U/L   Alkaline Phosphatase 124 38 - 126 U/L   Total Bilirubin 0.7 0.3 - 1.2 mg/dL   GFR calc non Af Amer >60 >60 mL/min   GFR calc Af Amer >60 >60 mL/min   Anion gap 5 5 - 15  CBC     Status: Abnormal   Collection Time: 02/17/16 11:02 PM  Result Value Ref Range   WBC 12.0 (H) 4.0 - 10.5 K/uL   RBC 4.15 3.87 - 5.11 MIL/uL   Hemoglobin 10.9 (L) 12.0 - 15.0 g/dL   HCT 09.633.4 (L) 04.536.0 - 40.946.0 %   MCV 80.5 78.0 - 100.0 fL   MCH 26.3 26.0 - 34.0 pg   MCHC 32.6 30.0 - 36.0 g/dL   RDW 81.115.2 91.411.5 - 78.215.5 %   Platelets 240 150 - 400 K/uL    FHT: 120, moderate with 15x15 accels. Occasional variable deceleration. Last deceleration noted around 2338.  MAU Course  Procedures  MDM 0015: D/W Dr. Rana SnareLowe, ok for DC home. Reviewed FHR tracing with Dr. Rana SnareLowe. FU as planned tomorrow.   Assessment and Plan   1. Transient hypertension of pregnancy in third trimester   2. [redacted] weeks gestation of pregnancy    DC home Comfort measures reviewed  3rd Trimester precautions  Labor precautions  Pre-eclampsia warning signs  Fetal kick counts RX: none  Return to MAU as needed FU with OB as planned  Follow-up Information    Follow up with Turner DanielsLOWE,DAVID C, MD.   Specialty:  Obstetrics and Gynecology   Why:  As scheduled   Contact information:   [address], [address] [place]  [id number] [phone]         Tawnya CrookHogan, [name] 02/17/2016, 10:53 PM

## 2016-02-17 NOTE — MAU Note (Signed)
Urine sent to lab 

## 2016-02-17 NOTE — Progress Notes (Signed)
Dr Rana SnareLowe notified of patients complaints and increase in BP,  [facility], left lateral position and continuing monitoring bp's

## 2016-02-18 DIAGNOSIS — O133 Gestational [pregnancy-induced] hypertension without significant proteinuria, third trimester: Secondary | ICD-10-CM | POA: Diagnosis not present

## 2016-02-18 DIAGNOSIS — Z3A37 37 weeks gestation of pregnancy: Secondary | ICD-10-CM | POA: Diagnosis not present

## 2016-02-18 DIAGNOSIS — O24410 Gestational diabetes mellitus in pregnancy, diet controlled: Secondary | ICD-10-CM | POA: Diagnosis not present

## 2016-02-18 DIAGNOSIS — Z3493 Encounter for supervision of normal pregnancy, unspecified, third trimester: Secondary | ICD-10-CM | POA: Diagnosis not present

## 2016-02-18 LAB — PROTEIN / CREATININE RATIO, URINE
Creatinine, Urine: 162 mg/dL
Protein Creatinine Ratio: 0.13 mg/mg{Cre} (ref 0.00–0.15)
TOTAL PROTEIN, URINE: 21 mg/dL

## 2016-02-18 NOTE — Discharge Instructions (Signed)

## 2016-02-19 ENCOUNTER — Encounter (HOSPITAL_COMMUNITY): Payer: Self-pay | Admitting: *Deleted

## 2016-02-19 ENCOUNTER — Telehealth (HOSPITAL_COMMUNITY): Payer: Self-pay | Admitting: *Deleted

## 2016-02-19 NOTE — Telephone Encounter (Signed)
Preadmission screen  

## 2016-02-19 NOTE — H&P (Signed)
NAMJenean Chambers:  Chambers, [name]            ACCOUNT NO.:  [id number]  MEDICAL RECORD NO.:  [id number]  LOCATION:                                FACILITY:  [facility]  PHYSICIAN:  [name], M.D.DATE OF BIRTH:  [birth year]  DATE OF ADMISSION:  02/20/2016 DATE OF DISCHARGE:                             HISTORY & PHYSICAL   CHIEF COMPLAINT:  Gestational hypertension at term.  HISTORY OF PRESENT ILLNESS:  A 30 year old, G5, P1-1-2-2, EDD May 12 presents at 5838 and 1/2 + weeks for induction with a favorable cervix due to hypertension.  Last seen April 24 in the office, she was 2, 50% and - 2.  PIH labs were normal.  BP at that time 130/80.  She was scheduled per Dr. Langston Chambers for induction.  Pregnancy has been complicated by gestational diabetes managed with diet only with good CBG control.  She does have a history of positive GBS.  The protocol for AROM induction including the possibility of Pitocin discussed with her.  Blood type is A positive.  Past medical history, for pertinent obstetrical history, surgical history, social and family history, please see the [facility] form for details.  PHYSICAL EXAMINATION:  VITAL SIGNS:  Temp 98.2, blood pressure 138/80. HEENT: Unremarkable. NECK:  Supple without masses.  LUNGS:  Clear.  CARDIOVASCULAR:  Regular rate and rhythm without murmurs, rubs, gallops.  Breasts not examined. Term fundal height.  Fetal heart rate 140. Cervix was 2, 50% and -2. Membranes intact. NEUROLOGIC:  Unremarkable.  IMPRESSION:  Gestational hypertension, unfavorable cervix.  PLAN:  AROM induction, antibiotics in labor.     [name], M.D.     RMH/MEDQ  D:  02/19/2016  T:  02/19/2016  Job:  [id number]

## 2016-02-19 NOTE — H&P (Deleted)
NAME:  Vanwagner, Jalea            ACCOUNT NO.:  [id number]  MEDICAL RECORD NO.:  [id number]  LOCATION:                                FACILITY:  [facility]  PHYSICIAN:  Suman Trivedi M. Geralda Baumgardner, M.D.DATE OF BIRTH:  09/16/1986  DATE OF ADMISSION:  02/20/2016 DATE OF DISCHARGE:                             HISTORY & PHYSICAL   CHIEF COMPLAINT:  Gestational hypertension at term.  HISTORY OF PRESENT ILLNESS:  A 29-year-old, G5, P1-1-2-2, EDD May 12 presents at 38 and 1/2 + weeks for induction with a favorable cervix due to hypertension.  Last seen April 24 in the office, she was 2, 50% and - 2.  PIH labs were normal.  BP at that time 130/80.  She was scheduled per Dr. Thommen for induction.  Pregnancy has been complicated by gestational diabetes managed with diet only with good CBG control.  She does have a history of positive GBS.  The protocol for AROM induction including the possibility of Pitocin discussed with her.  Blood type is A positive.  Past medical history, for pertinent obstetrical history, surgical history, social and family history, please see the [facility] form for details.  PHYSICAL EXAMINATION:  VITAL SIGNS:  Temp 98.2, blood pressure 138/80. HEENT: Unremarkable. NECK:  Supple without masses.  LUNGS:  Clear.  CARDIOVASCULAR:  Regular rate and rhythm without murmurs, rubs, gallops.  Breasts not examined. Term fundal height.  Fetal heart rate 140. Cervix was 2, 50% and -2. Membranes intact. NEUROLOGIC:  Unremarkable.  IMPRESSION:  Gestational hypertension, unfavorable cervix.  PLAN:  AROM induction, antibiotics in labor.     Raeya Merritts M. Tilda Samudio, M.D.     RMH/MEDQ  D:  02/19/2016  T:  02/19/2016  Job:  [id number] 

## 2016-02-20 ENCOUNTER — Encounter (HOSPITAL_COMMUNITY): Payer: Self-pay

## 2016-02-20 ENCOUNTER — Inpatient Hospital Stay (HOSPITAL_COMMUNITY): Payer: 59 | Admitting: Anesthesiology

## 2016-02-20 ENCOUNTER — Inpatient Hospital Stay (HOSPITAL_COMMUNITY)
Admission: RE | Admit: 2016-02-20 | Discharge: 2016-02-22 | DRG: 775 | Disposition: A | Discharge status: Home / Self Care | Payer: 59 | Source: Ambulatory Visit | Attending: Obstetrics and Gynecology | Admitting: Obstetrics and Gynecology

## 2016-02-20 DIAGNOSIS — O9962 Diseases of the digestive system complicating childbirth: Secondary | ICD-10-CM | POA: Diagnosis present

## 2016-02-20 DIAGNOSIS — O24420 Gestational diabetes mellitus in childbirth, diet controlled: Secondary | ICD-10-CM | POA: Diagnosis present

## 2016-02-20 DIAGNOSIS — Z91040 Latex allergy status: Secondary | ICD-10-CM

## 2016-02-20 DIAGNOSIS — O134 Gestational [pregnancy-induced] hypertension without significant proteinuria, complicating childbirth: Principal | ICD-10-CM | POA: Diagnosis present

## 2016-02-20 DIAGNOSIS — Z6841 Body Mass Index (BMI) 40.0 and over, adult: Secondary | ICD-10-CM | POA: Diagnosis not present

## 2016-02-20 DIAGNOSIS — O99824 Streptococcus B carrier state complicating childbirth: Secondary | ICD-10-CM | POA: Diagnosis present

## 2016-02-20 DIAGNOSIS — O99214 Obesity complicating childbirth: Secondary | ICD-10-CM | POA: Diagnosis present

## 2016-02-20 DIAGNOSIS — Z3A37 37 weeks gestation of pregnancy: Secondary | ICD-10-CM | POA: Diagnosis not present

## 2016-02-20 DIAGNOSIS — Z3A38 38 weeks gestation of pregnancy: Secondary | ICD-10-CM

## 2016-02-20 DIAGNOSIS — K219 Gastro-esophageal reflux disease without esophagitis: Secondary | ICD-10-CM | POA: Diagnosis present

## 2016-02-20 DIAGNOSIS — Z3490 Encounter for supervision of normal pregnancy, unspecified, unspecified trimester: Secondary | ICD-10-CM

## 2016-02-20 LAB — RPR: RPR: NONREACTIVE

## 2016-02-20 LAB — CBC
HCT: 34.1 % — ABNORMAL LOW (ref 36.0–46.0)
HEMATOCRIT: 34.8 % — AB (ref 36.0–46.0)
HEMOGLOBIN: 11.3 g/dL — AB (ref 12.0–15.0)
HEMOGLOBIN: 11.1 g/dL — AB (ref 12.0–15.0)
MCH: 26 pg (ref 26.0–34.0)
MCH: 26.2 pg (ref 26.0–34.0)
MCHC: 32.5 g/dL (ref 30.0–36.0)
MCHC: 32.6 g/dL (ref 30.0–36.0)
MCV: 80.2 fL (ref 78.0–100.0)
MCV: 80.6 fL (ref 78.0–100.0)
PLATELETS: 251 10*3/uL (ref 150–400)
Platelets: 238 10*3/uL (ref 150–400)
RBC: 4.34 MIL/uL (ref 3.87–5.11)
RBC: 4.23 MIL/uL (ref 3.87–5.11)
RDW: 15.4 % (ref 11.5–15.5)
RDW: 15.4 % (ref 11.5–15.5)
WBC: 13.4 10*3/uL — ABNORMAL HIGH (ref 4.0–10.5)
WBC: 13.3 10*3/uL — AB (ref 4.0–10.5)

## 2016-02-20 LAB — TYPE AND SCREEN
ABO/RH(D): A POS
Antibody Screen: NEGATIVE

## 2016-02-20 LAB — ABO/RH: ABO/RH(D): A POS

## 2016-02-20 MED ORDER — WITCH HAZEL-GLYCERIN EX PADS: 1.0000 "application " | MEDICATED_PAD | CUTANEOUS | Status: DC | PRN

## 2016-02-20 MED ORDER — OXYTOCIN BOLUS FROM INFUSION
500.0000 mL | INTRAVENOUS | Status: DC
Start: 2016-02-20 — End: 2016-02-20

## 2016-02-20 MED ORDER — LIDOCAINE HCL (PF) 1 % IJ SOLN
30.0000 mL | INTRAMUSCULAR | Status: DC | PRN
  Administered 2016-02-20: 30 mL via SUBCUTANEOUS
  Filled 2016-02-20: qty 30

## 2016-02-20 MED ORDER — ONDANSETRON HCL 4 MG/2ML IJ SOLN: 4.0000 mg | INTRAMUSCULAR | Status: DC | PRN

## 2016-02-20 MED ORDER — LIDOCAINE HCL (PF) 1 % IJ SOLN
INTRAMUSCULAR | Status: DC | PRN
  Administered 2016-02-20: 7 mL via EPIDURAL
  Administered 2016-02-20: 8 mL via EPIDURAL

## 2016-02-20 MED ORDER — PENICILLIN G POTASSIUM 5000000 UNITS IJ SOLR
2.5000 10*6.[IU] | INTRAVENOUS | Status: DC
  Administered 2016-02-20: 2.5 10*6.[IU] via INTRAVENOUS
  Filled 2016-02-20 (×4): qty 2.5

## 2016-02-20 MED ORDER — TERBUTALINE SULFATE 1 MG/ML IJ SOLN
0.2500 mg | Freq: Once | INTRAMUSCULAR | Status: DC | PRN
  Filled 2016-02-20: qty 1

## 2016-02-20 MED ORDER — MEASLES, MUMPS & RUBELLA VAC ~~LOC~~ INJ
0.5000 mL | INJECTION | Freq: Once | SUBCUTANEOUS | Status: DC
  Filled 2016-02-20: qty 0.5

## 2016-02-20 MED ORDER — FENTANYL 2.5 MCG/ML BUPIVACAINE 1/10 % EPIDURAL INFUSION (WH - ANES)
14.0000 mL/h | INTRAMUSCULAR | Status: DC | PRN
  Administered 2016-02-20: 14 mL/h via EPIDURAL
  Filled 2016-02-20: qty 125

## 2016-02-20 MED ORDER — LACTATED RINGERS IV SOLN: 500.0000 mL | INTRAVENOUS | Status: DC | PRN

## 2016-02-20 MED ORDER — SIMETHICONE 80 MG PO CHEW: 80.0000 mg | CHEWABLE_TABLET | ORAL | Status: DC | PRN

## 2016-02-20 MED ORDER — IBUPROFEN 800 MG PO TABS
800.0000 mg | ORAL_TABLET | Freq: Three times a day (TID) | ORAL | Status: DC | PRN
  Administered 2016-02-20 – 2016-02-22 (×5): 800 mg via ORAL
  Filled 2016-02-20 (×5): qty 1

## 2016-02-20 MED ORDER — LACTATED RINGERS IV SOLN: 500.0000 mL | Freq: Once | INTRAVENOUS | Status: DC

## 2016-02-20 MED ORDER — ACETAMINOPHEN 325 MG PO TABS: 650.0000 mg | ORAL_TABLET | ORAL | Status: DC | PRN

## 2016-02-20 MED ORDER — OXYCODONE-ACETAMINOPHEN 5-325 MG PO TABS: 1.0000 | ORAL_TABLET | ORAL | Status: DC | PRN

## 2016-02-20 MED ORDER — PENICILLIN G POTASSIUM 5000000 UNITS IJ SOLR
5.0000 10*6.[IU] | Freq: Once | INTRAVENOUS | Status: AC
  Administered 2016-02-20: 5 10*6.[IU] via INTRAVENOUS
  Filled 2016-02-20: qty 5

## 2016-02-20 MED ORDER — OXYTOCIN 10 UNIT/ML IJ SOLN
1.0000 m[IU]/min | INTRAVENOUS | Status: DC
  Filled 2016-02-20: qty 4

## 2016-02-20 MED ORDER — EPHEDRINE 5 MG/ML INJ
10.0000 mg | INTRAVENOUS | Status: DC | PRN
  Filled 2016-02-20: qty 2

## 2016-02-20 MED ORDER — CITRIC ACID-SODIUM CITRATE 334-500 MG/5ML PO SOLN: 30.0000 mL | ORAL | Status: DC | PRN

## 2016-02-20 MED ORDER — SENNOSIDES-DOCUSATE SODIUM 8.6-50 MG PO TABS
2.0000 | ORAL_TABLET | ORAL | Status: DC
  Administered 2016-02-20 – 2016-02-22 (×2): 2 via ORAL
  Filled 2016-02-20 (×2): qty 2

## 2016-02-20 MED ORDER — DIPHENHYDRAMINE HCL 25 MG PO CAPS: 25.0000 mg | ORAL_CAPSULE | Freq: Four times a day (QID) | ORAL | Status: DC | PRN

## 2016-02-20 MED ORDER — OXYTOCIN 10 UNIT/ML IJ SOLN
1.0000 m[IU]/min | INTRAVENOUS | Status: DC
  Administered 2016-02-20: 2 m[IU]/min via INTRAVENOUS

## 2016-02-20 MED ORDER — ONDANSETRON HCL 4 MG/2ML IJ SOLN: 4.0000 mg | Freq: Four times a day (QID) | INTRAMUSCULAR | Status: DC | PRN

## 2016-02-20 MED ORDER — OXYCODONE-ACETAMINOPHEN 5-325 MG PO TABS: 2.0000 | ORAL_TABLET | ORAL | Status: DC | PRN

## 2016-02-20 MED ORDER — DIPHENHYDRAMINE HCL 50 MG/ML IJ SOLN: 12.5000 mg | INTRAMUSCULAR | Status: DC | PRN

## 2016-02-20 MED ORDER — LACTATED RINGERS IV SOLN
INTRAVENOUS | Status: DC
Start: 2016-02-20 — End: 2016-02-20
  Administered 2016-02-20 (×2): via INTRAVENOUS

## 2016-02-20 MED ORDER — PHENYLEPHRINE 40 MCG/ML (10ML) SYRINGE FOR IV PUSH (FOR BLOOD PRESSURE SUPPORT)
80.0000 ug | PREFILLED_SYRINGE | INTRAVENOUS | Status: DC | PRN
  Filled 2016-02-20: qty 5
  Filled 2016-02-20: qty 20

## 2016-02-20 MED ORDER — PHENYLEPHRINE 40 MCG/ML (10ML) SYRINGE FOR IV PUSH (FOR BLOOD PRESSURE SUPPORT)
80.0000 ug | PREFILLED_SYRINGE | INTRAVENOUS | Status: DC | PRN
  Administered 2016-02-20: 80 ug via INTRAVENOUS
  Filled 2016-02-20: qty 5

## 2016-02-20 MED ORDER — OXYCODONE HCL 5 MG PO TABS: 5.0000 mg | ORAL_TABLET | ORAL | Status: DC | PRN

## 2016-02-20 MED ORDER — DIBUCAINE 1 % RE OINT
1.0000 | TOPICAL_OINTMENT | RECTAL | Status: DC | PRN
Start: 2016-02-20 — End: 2016-02-22

## 2016-02-20 MED ORDER — OXYCODONE HCL 5 MG PO TABS: 10.0000 mg | ORAL_TABLET | ORAL | Status: DC | PRN

## 2016-02-20 MED ORDER — FLEET ENEMA 7-19 GM/118ML RE ENEM: 1.0000 | ENEMA | Freq: Every day | RECTAL | Status: DC | PRN

## 2016-02-20 MED ORDER — BISACODYL 10 MG RE SUPP: 10.0000 mg | Freq: Every day | RECTAL | Status: DC | PRN

## 2016-02-20 MED ORDER — COCONUT OIL OIL
1.0000 | TOPICAL_OIL | Status: DC | PRN
Start: 2016-02-20 — End: 2016-02-22

## 2016-02-20 MED ORDER — FLEET ENEMA 7-19 GM/118ML RE ENEM: 1.0000 | ENEMA | RECTAL | Status: DC | PRN

## 2016-02-20 MED ORDER — ZOLPIDEM TARTRATE 5 MG PO TABS: 5.0000 mg | ORAL_TABLET | Freq: Every evening | ORAL | Status: DC | PRN

## 2016-02-20 MED ORDER — ACETAMINOPHEN 325 MG PO TABS
650.0000 mg | ORAL_TABLET | ORAL | Status: DC | PRN
  Administered 2016-02-20 – 2016-02-21 (×2): 650 mg via ORAL
  Filled 2016-02-20 (×2): qty 2

## 2016-02-20 MED ORDER — BENZOCAINE-MENTHOL 20-0.5 % EX AERO
1.0000 "application " | INHALATION_SPRAY | CUTANEOUS | Status: DC | PRN
  Administered 2016-02-20: 1 via TOPICAL
  Filled 2016-02-20: qty 56

## 2016-02-20 MED ORDER — TETANUS-DIPHTH-ACELL PERTUSSIS 5-2.5-18.5 LF-MCG/0.5 IM SUSP: 0.5000 mL | Freq: Once | INTRAMUSCULAR | Status: DC

## 2016-02-20 MED ORDER — OXYTOCIN 10 UNIT/ML IJ SOLN: 2.5000 [IU]/h | INTRAVENOUS | Status: DC

## 2016-02-20 MED ORDER — PRENATAL MULTIVITAMIN CH
1.0000 | ORAL_TABLET | Freq: Every day | ORAL | Status: DC
  Administered 2016-02-21 – 2016-02-22 (×2): 1 via ORAL
  Filled 2016-02-20 (×2): qty 1

## 2016-02-20 MED ORDER — ONDANSETRON HCL 4 MG PO TABS: 4.0000 mg | ORAL_TABLET | ORAL | Status: DC | PRN

## 2016-02-20 NOTE — Anesthesia Procedure Notes (Signed)
Epidural Patient location during procedure: OB Start time: 02/20/2016 3:04 PM End time: 02/20/2016 3:08 PM  Staffing Anesthesiologist: Leilani AbleHATCHETT, Paulena Servais Performed by: anesthesiologist   Preanesthetic Checklist Completed: patient identified, surgical consent, pre-op evaluation, timeout performed, IV checked, risks and benefits discussed and monitors and equipment checked  Epidural Patient position: sitting Prep: site prepped and draped and DuraPrep Patient monitoring: continuous pulse ox and blood pressure Approach: midline Location: L3-L4 Injection technique: LOR air  Needle:  Needle type: Tuohy  Needle gauge: 17 G Needle length: 9 cm and 9 Needle insertion depth: 7 cm Catheter type: closed end flexible Catheter size: 19 Gauge Catheter at skin depth: 12 cm Test dose: negative and Other  Assessment Sensory level: T9 Events: blood not aspirated, injection not painful, no injection resistance, negative IV test and no paresthesia  Additional Notes Reason for block:procedure for pain

## 2016-02-20 NOTE — Progress Notes (Signed)
Cx2/post/50%>>>ISE for AROM>>clr

## 2016-02-20 NOTE — Anesthesia Preprocedure Evaluation (Signed)
Anesthesia Evaluation  Patient identified by MRN, date of birth, ID band Patient awake    Reviewed: Allergy & Precautions, H&P , NPO status , Patient's Chart, lab work & pertinent test results  Airway Mallampati: II  TM Distance: >3 FB Neck ROM: full    Dental no notable dental hx.    Pulmonary neg pulmonary ROS,    Pulmonary exam normal        Cardiovascular hypertension, Normal cardiovascular exam     Neuro/Psych    GI/Hepatic Neg liver ROS,   Endo/Other  diabetesMorbid obesity  Renal/GU negative Renal ROS     Musculoskeletal   Abdominal (+) + obese,   Peds  Hematology negative hematology ROS (+)   Anesthesia Other Findings   Reproductive/Obstetrics (+) Pregnancy                             Anesthesia Physical Anesthesia Plan  ASA: III  Anesthesia Plan: Epidural   Post-op Pain Management:    Induction:   Airway Management Planned:   Additional Equipment:   Intra-op Plan:   Post-operative Plan:   Informed Consent: I have reviewed the patients History and Physical, chart, labs and discussed the procedure including the risks, benefits and alternatives for the proposed anesthesia with the patient or authorized representative who has indicated his/her understanding and acceptance.     Plan Discussed with:   Anesthesia Plan Comments:         Anesthesia Quick Evaluation

## 2016-02-20 NOTE — Anesthesia Postprocedure Evaluation (Signed)
Anesthesia Post Note  Patient: Clarita CraneStephanie D Karen  Procedure(s) Performed: * No procedures listed *  Patient location during evaluation: Mother Baby Anesthesia Type: Epidural Level of consciousness: awake and alert Pain management: satisfactory to patient Vital Signs Assessment: post-procedure vital signs reviewed and stable Respiratory status: respiratory function stable Cardiovascular status: stable Postop Assessment: no headache, no backache, epidural receding, patient able to bend at knees, no signs of nausea or vomiting and adequate PO intake Anesthetic complications: no Comments: Comfort level was assessed by AnesthesiaTeam and the patient was pleased with the care, interventions, and services provided by the Department of Anesthesia.     Last Vitals:  Filed Vitals:   02/20/16 1748 02/20/16 1839  BP: 142/78 141/65  Pulse: 84 91  Temp: 36.8 C 37 C  Resp: 18     Last Pain:  Filed Vitals:   02/20/16 1848  PainSc: 2    Pain Goal:                 Sophee Mckimmy

## 2016-02-21 LAB — CBC
HEMATOCRIT: 31 % — AB (ref 36.0–46.0)
HEMOGLOBIN: 10.1 g/dL — AB (ref 12.0–15.0)
MCH: 26.2 pg (ref 26.0–34.0)
MCHC: 32.6 g/dL (ref 30.0–36.0)
MCV: 80.5 fL (ref 78.0–100.0)
Platelets: 237 10*3/uL (ref 150–400)
RBC: 3.85 MIL/uL — ABNORMAL LOW (ref 3.87–5.11)
RDW: 15.6 % — AB (ref 11.5–15.5)
WBC: 13.4 10*3/uL — AB (ref 4.0–10.5)

## 2016-02-21 NOTE — Lactation Note (Signed)
This note was copied from a baby's chart. Lactation Consultation Note  Mom called out for latch assessment.  Observed mom position baby in cross cradle hold.  Mom has very good technique and baby latched easily and deep.  Reviewed breastfeeding basics.  Encouraged to call for assist/concerns prn.  Patient Name: [name] Jenean LindauStephanie Apfel ZOXWR'UToday's Date: 02/21/2016 Reason for consult: Follow-up assessment   Maternal Data Formula Feeding for Exclusion: No Has patient been taught Hand Expression?: Yes Does the patient have breastfeeding experience prior to this delivery?: Yes  Feeding Feeding Type: Breast Fed Length of feed: 10 min  LATCH Score/Interventions Latch: Grasps breast easily, tongue down, lips flanged, rhythmical sucking.  Audible Swallowing: A few with stimulation Intervention(s): Skin to skin;Hand expression;Alternate breast massage  Type of Nipple: Everted at rest and after stimulation  Comfort (Breast/Nipple): Soft / non-tender     Hold (Positioning): No assistance needed to correctly position infant at breast. Intervention(s): Breastfeeding basics reviewed  LATCH Score: 9  Lactation Tools Discussed/Used     Consult Status Consult Status: Follow-up Date: 02/22/16 Follow-up type: In-patient    Huston FoleyMOULDEN, Derius Ghosh S 02/21/2016, 12:31 PM

## 2016-02-21 NOTE — Progress Notes (Signed)
Post Partum Day 1 Subjective: no complaints, up ad lib, voiding, tolerating PO and + flatus  Objective: Blood pressure 124/75, pulse 91, temperature 98.2 F (36.8 C), temperature source Oral, resp. rate 18, height 5\' 5"  (1.651 m), weight 243 lb (110.224 kg), last menstrual period 06/01/2015, SpO2 98 %, unknown if currently breastfeeding.  Physical Exam:  General: alert and cooperative Lochia: appropriate Uterine Fundus: firm Incision: healing well DVT Evaluation: No evidence of DVT seen on physical exam. Negative Homan's sign. No cords or calf tenderness. No significant calf/ankle edema.   Recent Labs  02/20/16 1339 02/21/16 0455  HGB 11.1* 10.1*  HCT 34.1* 31.0*    Assessment/Plan: Plan for discharge tomorrow   LOS: 1 day   Judene Logue G 02/21/2016, 7:42 AM

## 2016-02-21 NOTE — Lactation Note (Signed)
This note was copied from a baby's chart. Lactation Consultation Note  Patient Name: [name] WJXBJ'YToday's Date: 02/21/2016 Reason for consult: Initial assessment   With this mom and baby, now 319 hours old, and 37 6/7 weeks CGA. Mom has been breast feeding with cues, and pumping with ?DEP, and spoon feeding colostrum as supplement. Mom said this has helped her milk transition in  The post, and she want to avoid having her baby et jaundiced as much as possible. Brief breastfeeding teaching reviewed with mom, as well as lactation services. Mom said she will be attending support groups for breastfeeidng.Mom said she would like help with latching, so I advised her to call the front desk when baby shows cues, and ask for lactation.   Maternal Data Formula Feeding for Exclusion: No Has patient been taught Hand Expression?: Yes Does the patient have breastfeeding experience prior to this delivery?: Yes  Feeding Feeding Type: Breast Fed Length of feed: 10 min  LATCH Score/Interventions    Audible Swallowing:  (easily expressed colostrum)  Type of Nipple: Everted at rest and after stimulation  Comfort (Breast/Nipple): Soft / non-tender           Lactation Tools Discussed/Used     Consult Status Consult Status: Follow-up Date: 02/22/16 Follow-up type: In-patient    [name], [name] 02/21/2016, 11:37 AM

## 2016-02-22 LAB — COMPREHENSIVE METABOLIC PANEL
ALK PHOS: 102 U/L (ref 38–126)
ALT: 14 U/L (ref 14–54)
ANION GAP: 4 — AB (ref 5–15)
AST: 20 U/L (ref 15–41)
Albumin: 2.8 g/dL — ABNORMAL LOW (ref 3.5–5.0)
BILIRUBIN TOTAL: 0.3 mg/dL (ref 0.3–1.2)
BUN: 11 mg/dL (ref 6–20)
CO2: 26 mmol/L (ref 22–32)
CREATININE: 0.64 mg/dL (ref 0.44–1.00)
Calcium: 8.6 mg/dL — ABNORMAL LOW (ref 8.9–10.3)
Chloride: 108 mmol/L (ref 101–111)
Glucose, Bld: 101 mg/dL — ABNORMAL HIGH (ref 65–99)
Potassium: 4 mmol/L (ref 3.5–5.1)
Sodium: 138 mmol/L (ref 135–145)
TOTAL PROTEIN: 6.3 g/dL — AB (ref 6.5–8.1)

## 2016-02-22 LAB — CBC
HCT: 33.3 % — ABNORMAL LOW (ref 36.0–46.0)
HEMOGLOBIN: 10.6 g/dL — AB (ref 12.0–15.0)
MCH: 26.2 pg (ref 26.0–34.0)
MCHC: 31.8 g/dL (ref 30.0–36.0)
MCV: 82.2 fL (ref 78.0–100.0)
Platelets: 236 10*3/uL (ref 150–400)
RBC: 4.05 MIL/uL (ref 3.87–5.11)
RDW: 15.9 % — ABNORMAL HIGH (ref 11.5–15.5)
WBC: 11.9 10*3/uL — ABNORMAL HIGH (ref 4.0–10.5)

## 2016-02-22 NOTE — Discharge Summary (Signed)
Obstetric Discharge Summary Reason for Admission: induction of labor Prenatal Procedures: ultrasound Intrapartum Procedures: spontaneous vaginal delivery Postpartum Procedures: none Complications-Operative and Postpartum: 1 degree perineal laceration HEMOGLOBIN  Date Value Ref Range Status  02/21/2016 10.1* 12.0 - 15.0 g/dL Final   HCT  Date Value Ref Range Status  02/21/2016 31.0* 36.0 - 46.0 % Final    Physical Exam:  General: alert and cooperative Lochia: appropriate Uterine Fundus: firm Incision: healing well DVT Evaluation: No evidence of DVT seen on physical exam. Negative Homan's sign. No cords or calf tenderness. Calf/Ankle edema is present.  Discharge Diagnoses: Term Pregnancy-delivered  Discharge Information: Date: 02/22/2016 Activity: pelvic rest Diet: routine Medications: PNV and Ibuprofen Condition: stable Instructions: refer to practice specific booklet Discharge to: home   Newborn Data: Live born female  Birth Weight: 6 lb 5.9 oz (2890 g) APGAR: 8, 9  Home with mother.  CURTIS,CAROL G 02/22/2016, 7:52 AM

## 2016-02-22 NOTE — Lactation Note (Signed)
This note was copied from a baby's chart. Lactation Consultation Note  Patient Name: [name] NWGNF'AToday's Date: 02/22/2016 Reason for consult: Follow-up assessment   Follow up with mom of 40 hour old infant. Infant with 17 BF for 10-60 minutes, 9 voids, and 6 stools in the last 24 hours. LATCH Scores 9 by LC/RN's. Infant weight 5 lb 15.1 oz with weight loss 7% since birth. Infant bilirubin 8.2 @ 37 hours of age.  Mom reports infant is cluster feeding. She reports her breasts are feeling heavier today, no s/s engorgement. Mom reports she has pumped x 1 while in hospital. She has a Medela PIS for home use. Mom reports nipple pain with initial latch that improves. Enc her to use EBM to nipples post feed. She is using coconut oil.   Mom with large compressible breasts with long everted nipples. Mom is aware how to check for deep latch. Mom latched infant independently to left breast while I was in the room, she did not wait for wide open mouth, encouraged her to do so. Infant pulled nipple back within a few sucks, bottom lip did need to be untucked. Infant noted to have rhythmic suckling and frequent swallows. Showed mom how to recognize swallows.  Infant was examined by Ped and Ped agreeable for baby to be d/c. Mom may have to stay for BP monitoring. Infant to have f/u Ped appt Sunday.   Reviewed all BF information in Taking Care of Baby and Me Booklet. Reviewed Engorgement Prevention/treatment with mom. Reviewed I/O and enc family to maintain feeding log and take to Ped visit. Reviewed LC Brochure, mom aware of OP Services, BF Support Groups and LC phone #. Mom plans to attend BF Support Groups. Enc mom to call with questions/concerns.      Maternal Data Formula Feeding for Exclusion: No Does the patient have breastfeeding experience prior to this delivery?: Yes  Feeding Feeding Type: Breast Fed Length of feed: 15 min  LATCH Score/Interventions Latch: Grasps breast easily, tongue  down, lips flanged, rhythmical sucking.  Audible Swallowing: Spontaneous and intermittent Intervention(s): Alternate breast massage  Type of Nipple: Everted at rest and after stimulation  Comfort (Breast/Nipple): Filling, red/small blisters or bruises, mild/mod discomfort  Problem noted: Mild/Moderate discomfort Interventions (Mild/moderate discomfort):  (EBM to nipples, Coconut oil, Deepen latch)  Hold (Positioning): No assistance needed to correctly position infant at breast. Intervention(s): Breastfeeding basics reviewed;Support Pillows;Position options;Skin to skin  LATCH Score: 9  Lactation Tools Discussed/Used WIC Program: No Pump Review: Milk Storage   Consult Status Consult Status: Complete Follow-up type: Call as needed    [name] 02/22/2016, 9:07 AM

## 2016-02-22 NOTE — Progress Notes (Signed)
Post Partum Day 2 Subjective: no complaints, up ad lib, voiding, tolerating PO, + flatus and denies HA,  No RUQ pain, does report h/o PIH with last pregnancy,m which required pp antihypertensives Objective: Blood pressure 151/89, pulse 90, temperature 98.2 F (36.8 C), temperature source Oral, resp. rate 18, height 5\' 5"  (1.651 m), weight 243 lb (110.224 kg), last menstrual period 06/01/2015, SpO2 98 %, unknown if currently breastfeeding.  Physical Exam:  General: alert and cooperative Lochia: appropriate Uterine Fundus: firm Incision: healing well DVT Evaluation: No evidence of DVT seen on physical exam. Negative Homan's sign. No cords or calf tenderness. Calf/Ankle edema is present. DTR's 2+ , no clonus   Recent Labs  02/20/16 1339 02/21/16 0455  HGB 11.1* 10.1*  HCT 34.1* 31.0*    Assessment/Plan: Cbc  And CMP  discussed signs and symptoms of PIH, patient would like to be discharged home if labs normal and plan to have BP checked in office early next week   LOS: 2 days   CURTIS,CAROL G 02/22/2016, 7:48 AM

## 2016-03-19 DIAGNOSIS — M26629 Arthralgia of temporomandibular joint, unspecified side: Secondary | ICD-10-CM | POA: Diagnosis not present

## 2016-03-19 DIAGNOSIS — H6981 Other specified disorders of Eustachian tube, right ear: Secondary | ICD-10-CM | POA: Diagnosis not present

## 2016-03-19 MED FILL — FLUTICASONE PROP 50 MCG SPR: 50 | 30 days supply | Qty: 16 | Fill #0

## 2016-03-26 ENCOUNTER — Telehealth (HOSPITAL_COMMUNITY): Payer: Self-pay | Admitting: Lactation Services

## 2016-03-26 NOTE — Telephone Encounter (Signed)
Patient called at [phone] / LC called mom back with 1/2 hour.  Mom had questions about her milk supply.  Baby is 631 month old and per mom in the evening baby non - stop breast feeding.  Per mom breast feel softer . During the day baby feeds usually one breast , and in the evening  Feeds both breast and then wants to feed again on both.  Per mom haven't down any extra pumping and has a DEBP at home.  So when the baby feeds on only one breast / the other breast is left full until the next feeding.  2 -3 hours. Also drinking milk tea.  LC recommended an O/P appt. . Mom able to come 6/2 at 9 am. LC encouraged mom to bring the baby hungry.  And hold off on pumping 1-2 hours before consult.  LC also recommended in the mean time to add post pumping after am feeding and may be a 2nd time  Even if it's with the hand pump. Some extra stimulation is better than none .

## 2016-03-28 ENCOUNTER — Ambulatory Visit (HOSPITAL_COMMUNITY): Payer: 59

## 2016-04-02 ENCOUNTER — Other Ambulatory Visit: Payer: Self-pay | Admitting: *Deleted

## 2016-04-02 NOTE — Patient Outreach (Signed)
Attempted to reach TuckerStephanie by phone to assess gestational diabetes post delivery and need to continue in the [facility] To Wellness program. Left message on [place]'s cell phone requesting call back. Bary RichardJanet S. Devanny Palecek RN,CCM,CDE [facility] Care Management Coordinator [facility] To [facility] Office Phone [phone] Office Fax [phone]

## 2016-04-07 DIAGNOSIS — Z1389 Encounter for screening for other disorder: Secondary | ICD-10-CM | POA: Diagnosis not present

## 2016-04-11 ENCOUNTER — Other Ambulatory Visit: Payer: Self-pay | Admitting: *Deleted

## 2016-04-11 NOTE — Patient Outreach (Signed)
Spoke with [name] via cell phone. Her gestational diabetes resolved when she gave birth to her daughter on 02/20/16, she is not longer testing her blood sugar and does not need to continue in the Link To Wellness program so will close case. Reminded her she is more at risk to develop Type II DM in the future and to call [facility] should the need for disease self management assistance arise in the future. Bary RichardJanet S. Hauser RN,CCM,CDE [facility] Care Management Coordinator [facility] To Well[facility] Office Phone [phone] Office Fax [phone]

## 2016-05-12 ENCOUNTER — Telehealth (HOSPITAL_COMMUNITY): Payer: Self-pay | Admitting: Lactation Services

## 2016-05-12 NOTE — Telephone Encounter (Signed)
Mother had sore nipples ( more on left) and was wondering if it could be yeast/thrush.  Occasionally shooting pain in breast.  No white patches in infant's mouth and no diaper rash.  Recommend if pain continues to call MD and Peds.  Suggest she also work on depth of latch and apply ebm and coconut oil.

## 2016-05-29 ENCOUNTER — Telehealth (HOSPITAL_COMMUNITY): Payer: Self-pay | Admitting: Lactation Services

## 2016-05-29 NOTE — Telephone Encounter (Signed)
Patient had been prescribed a compounded cream for her nipples (likely "all-purpose nipple ointment") and nystatin for her baby's mouth in case patient had yeast. Patient is no longer experiencing sharp pains, but still has some pinkness on the tips of her nipples. Her infant never had any signs of thrush. Patient had been using cream after every feeding, but now only uses twice/day.    Patient was wondering what else she could do.  I encouraged her to use the cream per its prescription.   In addition, baby will not take a bottle. I advised patient to only offer freshly EBM (the baby had been offered thawed milk) & to be sure that the infant is convinced that the patient (the mother) is not in the home.  Mom plans to keep her appt on Tues, August 8th.  Glenetta Hew, RN, IBCLC

## 2016-06-03 ENCOUNTER — Ambulatory Visit (HOSPITAL_COMMUNITY)
Admission: RE | Admit: 2016-06-03 | Discharge: 2016-06-03 | Disposition: A | Discharge status: Home / Self Care | Payer: 59 | Source: Ambulatory Visit | Attending: Obstetrics and Gynecology | Admitting: Obstetrics and Gynecology

## 2016-06-03 NOTE — Lactation Note (Signed)
Lactation Consult  Mother's reason for visit: "Latch/yeast" Lactation Consultant:  Remigio Eisenmengerichey, Mosie Angus Hamilton  ________________________________________________________________________ BW: 2890g (6# 5.9oz) 04-22-16: 11# 10oz at [facility] [facility] Today's weight: 12# 11oz __________________________________________________________  Mother's Name: Clarita CraneStephanie D Hankin Breastfeeding Experience: 3rd-time Mom (2 months w/1st; 22 months w/2nd) Maternal Medical Conditions:  anxiety Maternal Medications: APNO for yeast?, Nexium  ________________________________________________________________________  Breastfeeding History (Post Discharge)  Frequency of breastfeeding: q2-4 (5hrs at night) Duration of feeding:10-2115min  Infant Intake and Output Assessment  Voids: 6-8 in 24 hrs.  Color:  Clear yellow Stools: 2-4 in 24 hrs.  Color:  Yellow  ________________________________________________________________________  Maternal Breast Assessment  Breast:  Full Nipple:  Erect _______________________________________________________________________ Feeding Assessment/Evaluation  Initial feeding assessment:  Infant's oral assessment:  WNL  Attached assessment:  Deep  Lips flanged:  Yes.     Suck assessment:  Nutritive  Pre-feed weight: 5756 g   Post-feed weight: 5814 g  Amount transferred: 58 ml L breast, 9 min  Pre-feed weight: 5814g   Post-feed weight: 5846 g  Amount transferred: 32 ml R breast, 7 min  Total amount transferred: 88 ml  [name] is now 3.5 months old. She is more than 6# above BW. However,she has only gained 17oz over the last 6 weeks (average gain about 0.4oz/day).  Mom has a great milk supply (she can pump 2 oz in 2 minutes) & during the consult, [name] was able to transfer 3 oz in 16 min. Mom reports that she doesn't always offer the 2nd breast, as she can be busy with 2 older children (and [name] often seems content with 1 breast).  Mom's concerns: [name] will not take a bottle  & Mom returns to work prn this Friday. Mom was concerned about yeast & has been using APNO, but Mom seems to have been experiencing Raynaud's (Mom's nipple blanched during the exam & she reports that it blanches whenever [name] releases the latch & when she is getting out of the shower).  I suggested that Mom use a heating pad on low and place to her breasts after a feeding to see if that helps. The pinkness at the tips of the nipples do not appear to be yeast and it could be where there has been mild abrasion from latching [name] Peon([name] moves about considerably during feedings).   Mom is anxious. I discussed w/Mom how that can be a sign of PPMD. I gave her the phone # for [facility] ([facility]): [phone] & also made her aware of our support group on Tuesday mornings at 10am.  Plan: 1. Offer 2nd breast at feedings to see if [name] will take more. 2. Try a faster-flow nipple. 3. Heating pad on low to breasts after feedings. 4. Call [facility].    Glenetta HewKim Kimble Delaurentis, RN, IBCLC

## 2016-07-02 DIAGNOSIS — N912 Amenorrhea, unspecified: Secondary | ICD-10-CM | POA: Diagnosis not present

## 2016-09-25 ENCOUNTER — Telehealth (HOSPITAL_COMMUNITY): Payer: Self-pay | Admitting: Lactation Services

## 2016-09-25 NOTE — Telephone Encounter (Signed)
Mom called, left message and I called her back. Reports baby is now 727 months old and is losing weight. Weight 13- 13, then 13-10 and now 13-8 at ped today. He suggested formula but baby does not like to take bottle. Suggested sippy cup with breast milk in it first and then with formula. He gave her premie formula with higher calories and encouraged her to put it in oatmeal. He wants her to have solid foods 3 times/day. Mom is not pumping any and reports she is under a lot of stress. Is taking Fenugreek and Moringa. But not sure that they are helping. Encouraged pumping at least 2 times/day-more if possible, she reports she does not have time. Reviewed power pumping with her in case she would like to try that. Breastfed her last baby for 22 months. Encouragement given. To call back prn.

## 2016-09-26 DIAGNOSIS — N912 Amenorrhea, unspecified: Secondary | ICD-10-CM | POA: Diagnosis not present

## 2016-09-26 NOTE — Telephone Encounter (Signed)
Mom reports baby now 427 months old, losing weight. Weight decreased from 13-13 down to 13-10 now 13-8 with least peds visit. Peds following baby for weight checks every 2 weeks. Peds advised to start supplementing with formula and increase solids to 3 times per day. Baby not taking bottle. Baby took small amount of formula with Dr. Manson PasseyBrown bottle #3 nipple. Mom has taken pregnancy test today and this was positive. Asked if she could take Fenugreek while pregnant. Advised this was not recommended since fenugreek is uterine stimulant. Advised not to take. Mom asked about milk supply with pregnancy, taking progesterone supplements. Advised progesterone may decrease her milk supply, she will have to see. Mom plans to continue to pump. Advised if this causes uterine cramping to limit pumping. Discussed with Mom switching to formula completely. Mom reports FOB opposed to this. Advised Mom to BF as often as baby will nurse, continue solids as directed by Peds. If unable to get baby to take supplement via sippy cup or bottle to call Peds instead of waiting 2 weeks for next weight check.

## 2016-09-30 ENCOUNTER — Ambulatory Visit (HOSPITAL_COMMUNITY)
Admission: RE | Admit: 2016-09-30 | Discharge: 2016-09-30 | Disposition: A | Discharge status: Home / Self Care | Payer: 59 | Source: Ambulatory Visit | Attending: Obstetrics & Gynecology | Admitting: Obstetrics & Gynecology

## 2016-09-30 DIAGNOSIS — N912 Amenorrhea, unspecified: Secondary | ICD-10-CM | POA: Diagnosis not present

## 2016-09-30 NOTE — Lactation Note (Addendum)
Lactation Consult for [name] (mother) and [name] (DOB: [birth year])  Mother's reason for visit: weight loss Consult:  Follow-Up Lactation Consultant:  Larkin Ina ________________________________________________________________________ BW: 6# 5.9oz 04-22-16: 11# 10 oz 06-03-16: 12# 11oz 09-25-16: 13# 8oz (he weighed 13# 13 oz at some point before this date) 09-30-16: 13# 9oz  ________________________________________________________________________  Mother's Name: [name] Breastfeeding Experience: 3rd-time Mom _________________________________________________________________  Breastfeeding History (Post Discharge)  Frequency of breastfeeding: often Duration of feeding:  _______________________________________  Maternal Breast Assessment  Breast:  Soft Nipple:  Erect _______________________________________________________________________ Feeding Assessment/Evaluation  Initial feeding assessment:  Infant's oral assessment:  WNL  Tools:  Supplemental nutrition system Instructed on use and cleaning of tool:  Yes.    Pre-feed weight: 6168g Infant did not adequately latch w/double SNS Amount transferred: 0 ml Amount supplemented: almost 0.5oz w/ 2-oz feeding cup  Total supplement given:  Less than 15 ml  This mom had not come in for an LC appt, but had attended the breastfeeding support group (BFSG) today. I pulled her from the [place] met w/her privately when she told me about her infant's weight loss/poor gain. One pre- & post-weight showed a transfer of only 16m. Today, [name] 13# 9oz, which Mom reports as a weight loss from a max of 13# 13 oz. [name] only gained 1 oz over the last 5 days. [facility] 12# 11oz. Today's weight only reflects a net gain of 14 oz over the last few months. Mom reports that she gives her infant high-protein complementary foods (eggs, cooked chicken) &  other nutrient-dense foods (sweet potatoes). Mom reports that infant does not seem to have problems with eating the complementary foods, but shows a clear preference for breastfeeding. Mom does not object to giving formula, but [name] to feed from a bottle. [name] been refusing a bottle since the beginning of August, despite mother having tried different bottles, methods, etc.  I met w/Mom 1:1 to try a double SNS. Despite the thin tube, [name] Sleightseemed to notice it was there and would not maintain latch. We then swaddled [name]& had Mom cup feed her from a 2-oz feeding cup. [name] Sleighttook almost half an ounce, but was fussy and visibly tired. She soon fell asleep. I encouraged Mom to try these at home and to set-up an additional LC appt, which was scheduled for this Friday, Dec 8th. Mom reports that she already has an appt w/Dr. CMaisie Fusfor Dec 15th.  I called Mom around 1830 this evening (the above consult ended at 1230) to see how things were going with the double SNS or the cup feeding. Mom says she would try to use them again this evening. I suggested that if Mom tried the double SNS, then to do so on the 2nd breast in the hopes of preventing the refusal we saw earlier.  Of note, Mom found out that she was pregnant today. She is aware that pregnancy decreases milk supply & that she is no longer a candidate for certain galactagogues. I asked Mom to inform OB that she is breastfeeding when she begins prenatal care.   Note: Mom says she still has some symptoms that correspond w/vasospasm. On her L nipple, she appears to have a tiny scab (that could possibly be covering a nipple bleb[?]), which may be intensifying those symptoms on her L breast. Warm Epsom salt soaks recommended.   KElinor Dodge RN,  IBCLC

## 2016-10-02 ENCOUNTER — Telehealth (HOSPITAL_COMMUNITY): Payer: Self-pay | Admitting: Lactation Services

## 2016-10-02 MED FILL — TERCONAZOLE 0.8% VAGINAL CR: 0.8 | 3 days supply | Qty: 20 | Fill #0

## 2016-10-02 NOTE — Telephone Encounter (Addendum)
In an attempt to find a way that the patient's 7 month-old would allow herself to be supplemented, I called to see if  Mom was willing to try finger-feeding with the double SNS. Mom was unsure if [name] would do it, as she says that her daughter tended to only suck on a knuckle, and not an entire finger. I explained to Mom how to use the double SNS as a finger-feeder if [name] would allow it. I suggested that Mom at first try & get [name] to suck on their fingers in a playful way before trying to use them as a way to get her to eat.  Patient reports that her own mother is on her way to see if she can get [name] to take a bottle, as she did take 2 oz successfully with warmed formula yesterday.   Kim Amberli Ruegg, RN, IBCLC  10/03/16: Above note copy & pasted into baby's chart. KR 

## 2016-10-03 ENCOUNTER — Ambulatory Visit (HOSPITAL_COMMUNITY)
Admission: RE | Admit: 2016-10-03 | Discharge: 2016-10-03 | Disposition: A | Discharge status: Home / Self Care | Payer: 59 | Source: Ambulatory Visit | Attending: Obstetrics & Gynecology | Admitting: Obstetrics & Gynecology

## 2016-10-03 NOTE — Telephone Encounter (Signed)
In an attempt to find a way that the patient's 687 month-old would allow herself to be supplemented, I called to see if  Mom was willing to try finger-feeding with the double SNS. Mom was unsure if Denny Peonvery would do it, as she says that her daughter tended to only suck on a knuckle, and not an entire finger. I explained to Mom how to use the double SNS as a finger-feeder if Denny Peonvery would allow it. I suggested that Mom at first try & get Denny Peonvery to suck on their fingers in a playful way before trying to use them as a way to get her to eat.  Patient reports that her own mother is on her way to see if she can get Denny Peonvery to take a bottle, as she did take 2 oz successfully with warmed formula yesterday.   Glenetta HewKim Loralai Eisman, RN, IBCLC  10/03/16: Above note copy & pasted into baby's chart. KR

## 2016-10-03 NOTE — Lactation Note (Signed)
Lactation Consult -  Mom, grandmother and  baby [facility].  Baby awake and smiling. ) grandmother stepped out for the consult.  Mom had attended the breast feeding support group Tuesday. LC [facility] ( please see note from Glenetta HewKim Richey regarding that brief consult and the following phone  call with mom that was routed to DR. Cummings  Per mom the baby feeds on demand both breast 10 mins each and also feeds often at night. 6-8 wet diapers , 1 stool per day and until prune's ( baby food ) started daily the stools were constipated. Per mom was pumping once a day , but is it is very difficult to fit pumping in so has not been pumping. Baby has been receiving 3 meals a day of solids - 1st meal - egg and yogurt, 2nd meal vegetables and fruit and 3 rd meal Vegetables and fruit and breastfeeding in between there. Few days ago grand mother was able to get the baby to take 60 ml of formula from a bottle between feeding solids , but she hasn't taken once since.  Next appt. With Dr. Eddie Chambers is 12/15   Mom also shared she is pregnant and her OB Dr, office said it was ok to breast feed up to [redacted] weeks pregnant. Also mom mentioned she can't take herbs to increase milk supply due to being pregnant. Next Tuesday she will find out how far along she is with U/S.  Mom expressed I feel they think I'm not trying with these feedings from a bottle , but I am.   Per mom Dr. Eddie Chambers office called today to let her know there [facility] - oral in January.   Please see [facility] for [facility] below and [facility].   Mother's reason for visit: decreased weight  Visit Type: feeding weight check/ feeding assessment/  Appointment Notes:  Confirmed  Consult:  Follow-Up Lactation Consultant:  Kathrin GreathouseMargaret Ann Mykaila Chambers  ________________________________________________________________________ [name] FloresBaby's Name:  [name]  Pediatrician: Dr.   Eddie Chambers  Gender:  female Gestational Age: Birth Weight:   6-5.9 oz                      06/03/16 - 12-11 oz  09/25/16 - 13-8  13-13 oz Top weight per mom  [facility]  13-9  oz 12/5     Location: [facility] Tuesday at 11 am  Weight today:  13.7.8 oz 6118 g   ________________________________________________________________________  Mother's Name: [name] CornfieldStephanie D Boyles__________________________________ ________________________________________________________________________  Maternal Breast Assessment - mom requested to breast feed   Breast:  Soft Nipple:  Erect Pain level:  0 Pain interventions:  Expressed breast milk  _______________________________________________________________________ Feeding Assessment/Evaluation  Initial feeding assessment:  Infant's oral assessment:  LC WITH ORAL EXAM WITH GLOVE - noted a short labial frenulum which stretches with exam and when latched at the breast . Baby has lateral movement of tongue and no anterior restriction. [name] wasn't to happy with a gloved finger in hermouth and it was difficult to assess posterior frenulum issues. High palate noted.   Positioning:  Cradle - baby easily distracted , so LC quick checked latch and had to step out for feeding so baby could concentrate on the feeding.  Right breast and left   LATCH documentation:  Latch:  2 = Grasps  breast easily, tongue down, lips flanged, rhythmical sucking.  Audible swallowing:  1 = A few with stimulation  Type of nipple:  2 = Everted at rest and after stimulation  Comfort (Breast/Nipple):  2 = Soft / non-tender  Hold (Positioning):  2 = No assistance needed to correctly position infant at breast  LATCH score:  9   Attached assessment:  Deep  Lips flanged:  Yes.    Lips untucked:  Yes.    Suck assessment:  Nutritive  Tools:  Not at latch . But after latches -special needs feeder .  Instructed on use and cleaning of tool:   Yes.    Pre-feed weight: 6118 g , 13.7 oz  Post-feed weight: 6144 g , 13.8.7 oz  Amount transferred:  26 ml ( total )  Amount supplemented:   3 ml from a bottle     Total amount pumped post feed:  Did not post pump   Total amount transferred:  26 ml ( off total combined breast of 10-15 mins )  Total supplement given:  3 ml from special medela feeder .   Lactation Impression:  [name] down 2 oz from B[facility] weight Tuesday 13-9 oz to 13-7 oz  This consult was set up to see if [name] Peonvery would take a feeding from a Special Needs feeder . It was quite the challenge , and LC attempted to feed her sitting up and she would take 5-6 sucks and play with the nipple or take the nipple out of her mouth and start wiggling around and act frustrated . [facility] and tried with mom and LC walking out of the room and she would only suck a few times and play with the nipple in her mouth or the bottle in her hands.  LC also tried going back to a hospital yellow nipple and she sucked a few times and out came the bottle.  Mom expressed at the end of the consult - I feel I either need to to go all breast and work on building my milk supply or all formula . LC stressed since the baby is 497 months old and she is now pregnant ( natural for MS to decrease ) the likely hood of her beable to increase it back up isn't likely. Also [name] breast feeds well and to wean her off with her not taking a bottle is not a good idea. The breast right now is her source of hydration along with her meals. Mom aware the weight and this report will be sent to Dr.  Eddie Chambers.  This is a challenging situation - see LC plan below .    Lactation plan of Care :   Goals - to increase [name]'s weight ' Continue to breast feed on demand -  [name] Peonvery does well and she enjoys it  Continue to give 3 meals a day and add 4th in the evening  At the meals of veg / fruits - need to add cereal or protein  Call DR. Cummings office -  ask for ideas of foods to help the process of gaining weight for Auto[employer]very  Check websites - especially [employer] for older babies and suggestions to take a bottle ,  And more fluids, and different types of foods for her age to gain  Use either the hospital nipple or special needs feeder - allow [name] Peonvery to lie on the floor on a blanket and hold the bottle and just maybe she will  find her mouth and start sucking on it.  Sippy cup.

## 2016-10-07 DIAGNOSIS — N911 Secondary amenorrhea: Secondary | ICD-10-CM | POA: Diagnosis not present

## 2016-10-21 DIAGNOSIS — N911 Secondary amenorrhea: Secondary | ICD-10-CM | POA: Diagnosis not present

## 2016-10-21 MED FILL — PRENATE MINI SOFTGEL: 18-0.6-0.4- | 30 days supply | Qty: 30 | Fill #0

## 2016-10-21 MED FILL — DICLEGIS DR 10-10 MG TABLET: 10-10 | 30 days supply | Qty: 60 | Fill #0

## 2016-10-29 DIAGNOSIS — N911 Secondary amenorrhea: Secondary | ICD-10-CM | POA: Diagnosis not present

## 2016-11-04 DIAGNOSIS — Z3481 Encounter for supervision of other normal pregnancy, first trimester: Secondary | ICD-10-CM | POA: Diagnosis not present

## 2016-11-04 LAB — OB RESULTS CONSOLE HEPATITIS B SURFACE ANTIGEN: HEP B S AG: NEGATIVE

## 2016-11-04 LAB — OB RESULTS CONSOLE ABO/RH: RH Type: POSITIVE

## 2016-11-04 LAB — OB RESULTS CONSOLE RPR: RPR: NONREACTIVE

## 2016-11-04 LAB — OB RESULTS CONSOLE GC/CHLAMYDIA
CHLAMYDIA, DNA PROBE: NEGATIVE
Gonorrhea: NEGATIVE

## 2016-11-04 LAB — OB RESULTS CONSOLE RUBELLA ANTIBODY, IGM: RUBELLA: UNDETERMINED

## 2016-11-04 LAB — OB RESULTS CONSOLE HIV ANTIBODY (ROUTINE TESTING): HIV: NONREACTIVE

## 2016-11-04 LAB — OB RESULTS CONSOLE ANTIBODY SCREEN: Antibody Screen: NEGATIVE

## 2016-11-10 DIAGNOSIS — Z3A11 11 weeks gestation of pregnancy: Secondary | ICD-10-CM | POA: Diagnosis not present

## 2016-11-10 DIAGNOSIS — O26852 Spotting complicating pregnancy, second trimester: Secondary | ICD-10-CM | POA: Diagnosis not present

## 2016-11-14 DIAGNOSIS — Z3400 Encounter for supervision of normal first pregnancy, unspecified trimester: Secondary | ICD-10-CM | POA: Diagnosis not present

## 2016-11-14 DIAGNOSIS — Z3480 Encounter for supervision of other normal pregnancy, unspecified trimester: Secondary | ICD-10-CM | POA: Diagnosis not present

## 2016-11-14 DIAGNOSIS — Z113 Encounter for screening for infections with a predominantly sexual mode of transmission: Secondary | ICD-10-CM | POA: Diagnosis not present

## 2016-11-17 DIAGNOSIS — Z36 Encounter for antenatal screening for chromosomal anomalies: Secondary | ICD-10-CM | POA: Diagnosis not present

## 2016-11-17 DIAGNOSIS — Z3491 Encounter for supervision of normal pregnancy, unspecified, first trimester: Secondary | ICD-10-CM | POA: Diagnosis not present

## 2016-11-17 DIAGNOSIS — Z362 Encounter for other antenatal screening follow-up: Secondary | ICD-10-CM | POA: Diagnosis not present

## 2016-12-29 DIAGNOSIS — Z363 Encounter for antenatal screening for malformations: Secondary | ICD-10-CM | POA: Diagnosis not present

## 2016-12-29 DIAGNOSIS — Z3A18 18 weeks gestation of pregnancy: Secondary | ICD-10-CM | POA: Diagnosis not present

## 2016-12-29 DIAGNOSIS — Z3400 Encounter for supervision of normal first pregnancy, unspecified trimester: Secondary | ICD-10-CM | POA: Diagnosis not present

## 2017-01-12 DIAGNOSIS — Z3492 Encounter for supervision of normal pregnancy, unspecified, second trimester: Secondary | ICD-10-CM | POA: Diagnosis not present

## 2017-01-12 DIAGNOSIS — Z362 Encounter for other antenatal screening follow-up: Secondary | ICD-10-CM | POA: Diagnosis not present

## 2017-01-12 DIAGNOSIS — Z3A20 20 weeks gestation of pregnancy: Secondary | ICD-10-CM | POA: Diagnosis not present

## 2017-02-02 MED FILL — PANTOPRAZOLE SOD DR 40 MG T: 40 | 30 days supply | Qty: 30 | Fill #0

## 2017-02-19 DIAGNOSIS — R1011 Right upper quadrant pain: Secondary | ICD-10-CM | POA: Diagnosis not present

## 2017-02-19 DIAGNOSIS — Z3400 Encounter for supervision of normal first pregnancy, unspecified trimester: Secondary | ICD-10-CM | POA: Diagnosis not present

## 2017-02-19 DIAGNOSIS — Z36 Encounter for antenatal screening for chromosomal anomalies: Secondary | ICD-10-CM | POA: Diagnosis not present

## 2017-02-20 ENCOUNTER — Other Ambulatory Visit (HOSPITAL_COMMUNITY): Payer: Self-pay | Admitting: Obstetrics and Gynecology

## 2017-02-20 DIAGNOSIS — R1011 Right upper quadrant pain: Secondary | ICD-10-CM

## 2017-02-25 DIAGNOSIS — Z23 Encounter for immunization: Secondary | ICD-10-CM | POA: Diagnosis not present

## 2017-02-25 DIAGNOSIS — Z3480 Encounter for supervision of other normal pregnancy, unspecified trimester: Secondary | ICD-10-CM | POA: Diagnosis not present

## 2017-02-25 DIAGNOSIS — Z3400 Encounter for supervision of normal first pregnancy, unspecified trimester: Secondary | ICD-10-CM | POA: Diagnosis not present

## 2017-02-26 ENCOUNTER — Encounter (HOSPITAL_COMMUNITY): Payer: Self-pay

## 2017-02-26 ENCOUNTER — Ambulatory Visit (HOSPITAL_COMMUNITY): Payer: 59

## 2017-04-06 DIAGNOSIS — Z3400 Encounter for supervision of normal first pregnancy, unspecified trimester: Secondary | ICD-10-CM | POA: Diagnosis not present

## 2017-04-06 DIAGNOSIS — Z3688 Encounter for antenatal screening for fetal macrosomia: Secondary | ICD-10-CM | POA: Diagnosis not present

## 2017-04-06 MED FILL — PANTOPRAZOLE SOD DR 40 MG T: 40 | 30 days supply | Qty: 30 | Fill #1 | Status: TO

## 2017-04-17 MED FILL — PRENATE MINI SOFTGEL: 18-0.6-0.4- | 30 days supply | Qty: 30 | Fill #1

## 2017-04-20 DIAGNOSIS — Z3480 Encounter for supervision of other normal pregnancy, unspecified trimester: Secondary | ICD-10-CM | POA: Diagnosis not present

## 2017-04-27 DIAGNOSIS — Z3480 Encounter for supervision of other normal pregnancy, unspecified trimester: Secondary | ICD-10-CM | POA: Diagnosis not present

## 2017-04-27 LAB — OB RESULTS CONSOLE GBS: GBS: POSITIVE

## 2017-04-28 ENCOUNTER — Inpatient Hospital Stay (HOSPITAL_COMMUNITY)
Admission: AD | Admit: 2017-04-28 | Discharge: 2017-04-28 | Disposition: A | Discharge status: Home / Self Care | Payer: 59 | Source: Ambulatory Visit | Attending: Obstetrics and Gynecology | Admitting: Obstetrics and Gynecology

## 2017-04-28 ENCOUNTER — Encounter (HOSPITAL_COMMUNITY): Payer: Self-pay | Admitting: *Deleted

## 2017-04-28 DIAGNOSIS — Z3A35 35 weeks gestation of pregnancy: Secondary | ICD-10-CM | POA: Insufficient documentation

## 2017-04-28 DIAGNOSIS — O133 Gestational [pregnancy-induced] hypertension without significant proteinuria, third trimester: Secondary | ICD-10-CM | POA: Diagnosis not present

## 2017-04-28 DIAGNOSIS — R030 Elevated blood-pressure reading, without diagnosis of hypertension: Secondary | ICD-10-CM | POA: Diagnosis present

## 2017-04-28 LAB — COMPREHENSIVE METABOLIC PANEL
ALBUMIN: 2.9 g/dL — AB (ref 3.5–5.0)
ALK PHOS: 108 U/L (ref 38–126)
ALT: 11 U/L — AB (ref 14–54)
AST: 16 U/L (ref 15–41)
Anion gap: 8 (ref 5–15)
BILIRUBIN TOTAL: 0.4 mg/dL (ref 0.3–1.2)
BUN: 9 mg/dL (ref 6–20)
CALCIUM: 8.7 mg/dL — AB (ref 8.9–10.3)
CO2: 22 mmol/L (ref 22–32)
Chloride: 105 mmol/L (ref 101–111)
Creatinine, Ser: 0.6 mg/dL (ref 0.44–1.00)
GFR calc Af Amer: >60 mL/min (ref >60)
GFR calc non Af Amer: >60 mL/min (ref >60)
GLUCOSE: 76 mg/dL (ref 65–99)
Potassium: 3.8 mmol/L (ref 3.5–5.1)
Sodium: 135 mmol/L (ref 135–145)
TOTAL PROTEIN: 6.6 g/dL (ref 6.5–8.1)

## 2017-04-28 LAB — URINALYSIS, ROUTINE W REFLEX MICROSCOPIC
BACTERIA UA: NONE SEEN
BILIRUBIN URINE: NEGATIVE
Glucose, UA: NEGATIVE mg/dL
Hgb urine dipstick: NEGATIVE
Ketones, ur: NEGATIVE mg/dL
LEUKOCYTES UA: NEGATIVE
Nitrite: NEGATIVE
PROTEIN: 30 mg/dL — AB
SPECIFIC GRAVITY, URINE: 1.028 (ref 1.005–1.030)
pH: 7 (ref 5.0–8.0)

## 2017-04-28 LAB — CBC
HEMATOCRIT: 35.5 % — AB (ref 36.0–46.0)
Hemoglobin: 11.7 g/dL — ABNORMAL LOW (ref 12.0–15.0)
MCH: 27.2 pg (ref 26.0–34.0)
MCHC: 33 g/dL (ref 30.0–36.0)
MCV: 82.6 fL (ref 78.0–100.0)
Platelets: 252 10*3/uL (ref 150–400)
RBC: 4.3 MIL/uL (ref 3.87–5.11)
RDW: 14.9 % (ref 11.5–15.5)
WBC: 11.7 10*3/uL — ABNORMAL HIGH (ref 4.0–10.5)

## 2017-04-28 LAB — PROTEIN / CREATININE RATIO, URINE
Creatinine, Urine: 232 mg/dL
Protein Creatinine Ratio: 0.06 mg/mg{Cre} (ref 0.00–0.15)
TOTAL PROTEIN, URINE: 14 mg/dL

## 2017-04-28 MED ORDER — LABETALOL HCL 5 MG/ML IV SOLN: 20.0000 mg | INTRAVENOUS | Status: DC | PRN

## 2017-04-28 MED ORDER — HYDRALAZINE HCL 20 MG/ML IJ SOLN: 10.0000 mg | Freq: Once | INTRAMUSCULAR | Status: DC | PRN

## 2017-04-28 MED ORDER — LACTATED RINGERS IV SOLN: INTRAVENOUS | Status: DC | PRN

## 2017-04-28 NOTE — MAU Provider Note (Signed)
Chief Complaint  Patient presents with  . Hypertension     First Provider Initiated Contact with Patient 04/28/17 1636      S: Clarita CraneStephanie D Leavell  is a 31 y.o. y.o. year old G6P2123 female at 7552w3d weeks gestation who presents to MAU with elevated blood pressures. Pos Hx Pre-E. Current blood pressure medication: None  Associated symptoms: 2/10 Headache, No true vision changes, No epigastric pain Contractions: Mild Vaginal bleeding: None Fetal movement: Nml  O:  Patient Vitals for the past 24 hrs:  BP Temp Temp src Pulse Resp SpO2 Height Weight  04/28/17 1700 (!) 143/77 - - 98 - 99 % - -  04/28/17 1646 - - - - - 98 % - -  04/28/17 1645 137/75 - - 91 - - - -  04/28/17 1635 - - - - - 97 % - -  04/28/17 1630 135/75 - - 93 - - - -  04/28/17 1621 (!) 143/83 - - 99 - 96 % - -  04/28/17 1556 (!) 147/92 98.1 F (36.7 C) Oral 98 18 97 % 5\' 5"  (1.651 m) 261 lb (118.4 kg)   General: NAD Heart: Regular rate Lungs: Normal rate and effort Abd: Soft, NT, Gravid, S=D Extremities: 2+ Pedal edema Neuro: 2+ deep tendon reflexes, No clonus Pelvic: NEFG, no bleeding or LOF.      EFM: 140, Moderate variability, 15 x 15 accelerations, no decelerations Toco: Rare, mild  Results for orders placed or performed during the hospital encounter of 04/28/17 (from the past 24 hour(s))  Protein / creatinine ratio, urine     Status: None   Collection Time: 04/28/17  4:00 PM  Result Value Ref Range   Creatinine, Urine 232.00 mg/dL   Total Protein, Urine 14 mg/dL   Protein Creatinine Ratio 0.06 0.00 - 0.15 mg/mg[Cre]  Urinalysis, Routine w reflex microscopic     Status: Abnormal   Collection Time: 04/28/17  4:00 PM  Result Value Ref Range   Color, Urine YELLOW YELLOW   APPearance CLEAR CLEAR   Specific Gravity, Urine 1.028 1.005 - 1.030   pH 7.0 5.0 - 8.0   Glucose, UA NEGATIVE NEGATIVE mg/dL   Hgb urine dipstick NEGATIVE NEGATIVE   Bilirubin Urine NEGATIVE NEGATIVE   Ketones, ur NEGATIVE NEGATIVE  mg/dL   Protein, ur 30 (A) NEGATIVE mg/dL   Nitrite NEGATIVE NEGATIVE   Leukocytes, UA NEGATIVE NEGATIVE   RBC / HPF 0-5 0 - 5 RBC/hpf   WBC, UA 0-5 0 - 5 WBC/hpf   Bacteria, UA NONE SEEN NONE SEEN   Squamous Epithelial / LPF 0-5 (A) NONE SEEN   Mucous PRESENT   Comprehensive metabolic panel     Status: Abnormal   Collection Time: 04/28/17  4:19 PM  Result Value Ref Range   Sodium 135 135 - 145 mmol/L   Potassium 3.8 3.5 - 5.1 mmol/L   Chloride 105 101 - 111 mmol/L   CO2 22 22 - 32 mmol/L   Glucose, Bld 76 65 - 99 mg/dL   BUN 9 6 - 20 mg/dL   Creatinine, Ser 4.090.60 0.44 - 1.00 mg/dL   Calcium 8.7 (L) 8.9 - 10.3 mg/dL   Total Protein 6.6 6.5 - 8.1 g/dL   Albumin 2.9 (L) 3.5 - 5.0 g/dL   AST 16 15 - 41 U/L   ALT 11 (L) 14 - 54 U/L   Alkaline Phosphatase 108 38 - 126 U/L   Total Bilirubin 0.4 0.3 - 1.2 mg/dL   GFR calc  non Af Amer >60 >60 mL/min   GFR calc Af Amer >60 >60 mL/min   Anion gap 8 5 - 15  CBC     Status: Abnormal   Collection Time: 04/28/17  4:19 PM  Result Value Ref Range   WBC 11.7 (H) 4.0 - 10.5 K/uL   RBC 4.30 3.87 - 5.11 MIL/uL   Hemoglobin 11.7 (L) 12.0 - 15.0 g/dL   HCT 16.1 (L) 09.6 - 04.5 %   MCV 82.6 78.0 - 100.0 fL   MCH 27.2 26.0 - 34.0 pg   MCHC 33.0 30.0 - 36.0 g/dL   RDW 40.9 81.1 - 91.4 %   Platelets 252 150 - 400 K/uL   Discussed Hx, labs, exam w/ Dr. Elon Spanner. Agrees w/ POC. New orders: None.   A: [redacted]w[redacted]d week IUP Gestational hypertension FHR reactive  P: Discharge home in stable condition per consult with Ranae Pila, MDPreeclampsia precautions. Follow-up for blood pressure check in 3 days at your doctor's office sooner as needed if symptoms worsen. Return to maternity admissions as needed in emergencies  [place], [place], [place] 04/28/2017 5:27 PM

## 2017-04-28 NOTE — Discharge Instructions (Signed)
Hypertension During Pregnancy °Hypertension, commonly called high blood pressure, is when the force of blood pumping through your arteries is too strong. Arteries are blood vessels that carry blood from the heart throughout the body. Hypertension during pregnancy can cause problems for you and your baby. Your baby may be born early (prematurely) or may not weigh as much as he or she should at birth. Very bad cases of hypertension during pregnancy can be life-threatening. °Different types of hypertension can occur during pregnancy. These include: °· Chronic hypertension. This happens when: °? You have hypertension before pregnancy and it continues during pregnancy. °? You develop hypertension before you are [redacted] weeks pregnant, and it continues during pregnancy. °· Gestational hypertension. This is hypertension that develops after the 20th week of pregnancy. °· Preeclampsia, also called toxemia of pregnancy. This is a very serious type of hypertension that develops only during pregnancy. It affects the whole body, and it can be very dangerous for you and your baby. ° °Gestational hypertension and preeclampsia usually go away within 6 weeks after your baby is born. Women who have hypertension during pregnancy have a greater chance of developing hypertension later in life or during future pregnancies. °What are the causes? °The exact cause of hypertension is not known. °What increases the risk? °There are certain factors that make it more likely for you to develop hypertension during pregnancy. These include: °· Having hypertension during a previous pregnancy or prior to pregnancy. °· Being overweight. °· Being older than age 40. °· Being pregnant for the first time or being pregnant with more than one baby. °· Becoming pregnant using fertilization methods such as IVF (in vitro fertilization). °· Having diabetes, kidney problems, or systemic lupus erythematosus. °· Having a family history of hypertension. ° °What are the  signs or symptoms? °Chronic hypertension and gestational hypertension rarely cause symptoms. Preeclampsia causes symptoms, which may include: °· Increased protein in your urine. Your health care provider will check for this at every visit before you give birth (prenatal visit). °· Severe headaches. °· Sudden weight gain. °· Swelling of the hands, face, legs, and feet. °· Nausea and vomiting. °· Vision problems, such as blurred or double vision. °· Numbness in the face, arms, legs, and feet. °· Dizziness. °· Slurred speech. °· Sensitivity to bright lights. °· Abdominal pain. °· Convulsions. ° °How is this diagnosed? °You may be diagnosed with hypertension during a routine prenatal exam. At each prenatal visit, you may: °· Have a urine test to check for high amounts of protein in your urine. °· Have your blood pressure checked. A blood pressure reading is recorded as two numbers, such as "120 over 80" (or 120/80). The first ("top") number is called the systolic pressure. It is a measure of the pressure in your arteries when your heart beats. The second ("bottom") number is called the diastolic pressure. It is a measure of the pressure in your arteries as your heart relaxes between beats. Blood pressure is measured in a unit called mm Hg. A normal blood pressure reading is: °? Systolic: below 120. °? Diastolic: below 80. ° °The type of hypertension that you are diagnosed with depends on your test results and when your symptoms developed. °· Chronic hypertension is usually diagnosed before 20 weeks of pregnancy. °· Gestational hypertension is usually diagnosed after 20 weeks of pregnancy. °· Hypertension with high amounts of protein in the urine is diagnosed as preeclampsia. °· Blood pressure measurements that stay above 160 systolic, or above 110 diastolic, are   signs of severe preeclampsia. ° °How is this treated? °Treatment for hypertension during pregnancy varies depending on the type of hypertension you have and how  serious it is. °· If you take medicines called ACE inhibitors to treat chronic hypertension, you may need to switch medicines. ACE inhibitors should not be taken during pregnancy. °· If you have gestational hypertension, you may need to take blood pressure medicine. °· If you are at risk for preeclampsia, your health care provider may recommend that you take a low-dose aspirin every day to prevent high blood pressure during your pregnancy. °· If you have severe preeclampsia, you may need to be hospitalized so you and your baby can be monitored closely. You may also need to take medicine (magnesium sulfate) to prevent seizures and to lower blood pressure. This medicine may be given as an injection or through an IV tube. °· In some cases, if your condition gets worse, you may need to deliver your baby early. ° °Follow these instructions at home: °Eating and drinking °· Drink enough fluid to keep your urine clear or pale yellow. °· Eat a healthy diet that is low in salt (sodium). Do not add salt to your food. Check food labels to see how much sodium a food or beverage contains. °Lifestyle °· Do not use any products that contain nicotine or tobacco, such as cigarettes and e-cigarettes. If you need help quitting, ask your health care provider. °· Do not use alcohol. °· Avoid caffeine. °· Avoid stress as much as possible. Rest and get plenty of sleep. °General instructions °· Take over-the-counter and prescription medicines only as told by your health care provider. °· While lying down, lie on your left side. This keeps pressure off your baby. °· While sitting or lying down, raise (elevate) your feet. Try putting some pillows under your lower legs. °· Exercise regularly. Ask your health care provider what kinds of exercise are best for you. °· Keep all prenatal and follow-up visits as told by your health care provider. This is important. °Contact a health care provider if: °· You have symptoms that your health care  provider told you may require more treatment or monitoring, such as: °? Fever. °? Vomiting. °? Headache. °Get help right away if: °· You have severe abdominal pain or vomiting that does not get better with treatment. °· You suddenly develop swelling in your hands, ankles, or face. °· You gain 4 lbs (1.8 kg) or more in 1 week. °· You develop vaginal bleeding, or you have blood in your urine. °· You do not feel your baby moving as much as usual. °· You have blurred or double vision. °· You have muscle twitching or sudden tightening (spasms). °· You have shortness of breath. °· Your lips or fingernails turn blue. °This information is not intended to replace advice given to you by your health care provider. Make sure you discuss any questions you have with your health care provider. °Document Released: 07/01/2011 Document Revised: 05/02/2016 Document Reviewed: 03/28/2016 °Elsevier Interactive Patient Education © 2018 [employer] ° °

## 2017-04-28 NOTE — MAU Note (Signed)
Elevated BP, Dr Elon SpannerLeger saw her and wanted her to come be further eval. Has been doing a gradual creap.  Hx of pre-eclampsia with prior preg.  Slight HA, has not taken anything thing; swelling has been increasing, occasional black floater- off and on, denies  epigastric pain.

## 2017-04-30 DIAGNOSIS — O368330 Maternal care for abnormalities of the fetal heart rate or rhythm, third trimester, not applicable or unspecified: Secondary | ICD-10-CM | POA: Diagnosis not present

## 2017-04-30 DIAGNOSIS — Z3400 Encounter for supervision of normal first pregnancy, unspecified trimester: Secondary | ICD-10-CM | POA: Diagnosis not present

## 2017-04-30 DIAGNOSIS — Z3A35 35 weeks gestation of pregnancy: Secondary | ICD-10-CM | POA: Diagnosis not present

## 2017-05-04 ENCOUNTER — Telehealth (HOSPITAL_COMMUNITY): Payer: Self-pay | Admitting: *Deleted

## 2017-05-04 ENCOUNTER — Encounter (HOSPITAL_COMMUNITY): Payer: Self-pay | Admitting: *Deleted

## 2017-05-04 DIAGNOSIS — O4103X0 Oligohydramnios, third trimester, not applicable or unspecified: Secondary | ICD-10-CM | POA: Diagnosis not present

## 2017-05-04 DIAGNOSIS — Z3493 Encounter for supervision of normal pregnancy, unspecified, third trimester: Secondary | ICD-10-CM | POA: Diagnosis not present

## 2017-05-04 DIAGNOSIS — Z3A36 36 weeks gestation of pregnancy: Secondary | ICD-10-CM | POA: Diagnosis not present

## 2017-05-04 NOTE — Telephone Encounter (Signed)
Preadmission screen  

## 2017-05-09 ENCOUNTER — Inpatient Hospital Stay (EMERGENCY_DEPARTMENT_HOSPITAL)
Admission: AD | Admit: 2017-05-09 | Discharge: 2017-05-10 | Disposition: A | Discharge status: Home / Self Care | Payer: 59 | Source: Ambulatory Visit | Attending: Obstetrics and Gynecology | Admitting: Obstetrics and Gynecology

## 2017-05-09 ENCOUNTER — Encounter (HOSPITAL_COMMUNITY): Payer: Self-pay | Admitting: *Deleted

## 2017-05-09 DIAGNOSIS — Z3689 Encounter for other specified antenatal screening: Secondary | ICD-10-CM

## 2017-05-09 DIAGNOSIS — O134 Gestational [pregnancy-induced] hypertension without significant proteinuria, complicating childbirth: Secondary | ICD-10-CM | POA: Diagnosis not present

## 2017-05-09 DIAGNOSIS — O479 False labor, unspecified: Secondary | ICD-10-CM | POA: Diagnosis not present

## 2017-05-09 DIAGNOSIS — Z3A37 37 weeks gestation of pregnancy: Secondary | ICD-10-CM | POA: Diagnosis not present

## 2017-05-09 DIAGNOSIS — O99214 Obesity complicating childbirth: Secondary | ICD-10-CM | POA: Diagnosis not present

## 2017-05-09 DIAGNOSIS — K219 Gastro-esophageal reflux disease without esophagitis: Secondary | ICD-10-CM | POA: Diagnosis not present

## 2017-05-09 DIAGNOSIS — O99824 Streptococcus B carrier state complicating childbirth: Secondary | ICD-10-CM | POA: Diagnosis not present

## 2017-05-09 DIAGNOSIS — O133 Gestational [pregnancy-induced] hypertension without significant proteinuria, third trimester: Secondary | ICD-10-CM | POA: Diagnosis not present

## 2017-05-09 DIAGNOSIS — Z6841 Body Mass Index (BMI) 40.0 and over, adult: Secondary | ICD-10-CM | POA: Diagnosis not present

## 2017-05-09 DIAGNOSIS — Z91040 Latex allergy status: Secondary | ICD-10-CM | POA: Diagnosis not present

## 2017-05-09 DIAGNOSIS — O9962 Diseases of the digestive system complicating childbirth: Secondary | ICD-10-CM | POA: Diagnosis not present

## 2017-05-09 LAB — CBC
HEMATOCRIT: 35.6 % — AB (ref 36.0–46.0)
HEMOGLOBIN: 11.6 g/dL — AB (ref 12.0–15.0)
MCH: 26.8 pg (ref 26.0–34.0)
MCHC: 32.6 g/dL (ref 30.0–36.0)
MCV: 82.2 fL (ref 78.0–100.0)
Platelets: 225 10*3/uL (ref 150–400)
RBC: 4.33 MIL/uL (ref 3.87–5.11)
RDW: 15 % (ref 11.5–15.5)
WBC: 11.9 10*3/uL — AB (ref 4.0–10.5)

## 2017-05-09 LAB — COMPREHENSIVE METABOLIC PANEL
ALBUMIN: 2.7 g/dL — AB (ref 3.5–5.0)
ALK PHOS: 113 U/L (ref 38–126)
ALT: 12 U/L — AB (ref 14–54)
AST: 15 U/L (ref 15–41)
Anion gap: 8 (ref 5–15)
BILIRUBIN TOTAL: 0.7 mg/dL (ref 0.3–1.2)
BUN: 8 mg/dL (ref 6–20)
CALCIUM: 8.7 mg/dL — AB (ref 8.9–10.3)
CO2: 25 mmol/L (ref 22–32)
CREATININE: 0.56 mg/dL (ref 0.44–1.00)
Chloride: 104 mmol/L (ref 101–111)
GFR calc Af Amer: >60 mL/min (ref >60)
GLUCOSE: 89 mg/dL (ref 65–99)
Potassium: 3.8 mmol/L (ref 3.5–5.1)
Sodium: 137 mmol/L (ref 135–145)
TOTAL PROTEIN: 5.8 g/dL — AB (ref 6.5–8.1)

## 2017-05-09 LAB — PROTEIN / CREATININE RATIO, URINE
CREATININE, URINE: 164 mg/dL
Protein Creatinine Ratio: 0.12 mg/mg{Cre} (ref 0.00–0.15)
Total Protein, Urine: 20 mg/dL

## 2017-05-09 MED ORDER — ACETAMINOPHEN 500 MG PO TABS
1000.0000 mg | ORAL_TABLET | Freq: Four times a day (QID) | ORAL | Status: DC | PRN
Start: 2017-05-09 — End: 2017-05-10
  Administered 2017-05-09: 1000 mg via ORAL
  Filled 2017-05-09: qty 2

## 2017-05-09 NOTE — MAU Note (Signed)
Urine sent to lab 

## 2017-05-09 NOTE — MAU Provider Note (Signed)
History     CSN: [id number]  Arrival date and time: 05/09/17 2140  Chief Complaint  Patient presents with  . Labor Eval   [id number] @37  weeks here for labor check and found to have elevated BPs. She reports temporal HA today. Has not taken anything for it. Occasional visual floater but had this prior to pregnancy. No epigastric pain. Reports good FM. Ctx are q5 min x3 hrs. No VB or LOF. Pregnancy has been complicated by hx of preeclampsia with first child and GHTN this pregnancy with IOL planned for tomorrow.   OB History    Gravida Para Term Preterm AB Living   6 3 2 1 2 3    SAB TAB Ectopic Multiple Live Births   2     0 3      Past Medical History:  Diagnosis Date  . Acid reflux   . Anxiety   . Classic migraine 09/12/2014  . Gestational diabetes    no meds  . History of gestational hypertension   . Hx of varicella   . Hypertension    no meds  . Infertility management   . Vaginal Pap smear, abnormal     Past Surgical History:  Procedure Laterality Date  . DILATION AND CURETTAGE OF UTERUS    . LEEP    . TONSILLECTOMY      Family History  Problem Relation Age of Onset  . Diabetes Mother   . Cervical cancer Mother   . Colon polyps Mother   . Diabetes Father   . Heart disease Father   . Liver disease Father   . Hypertension Father   . Migraines Daughter   . Hypothyroidism Daughter   . Diabetes Maternal Grandmother   . Kidney disease Maternal Grandmother        renal failure  . Cancer Maternal Grandmother        kidney    Social History  Substance Use Topics  . Smoking status: Never Smoker  . Smokeless tobacco: Never Used  . Alcohol use No    Allergies:  Allergies  Allergen Reactions  . Cephalosporins     Had an allergy test and was told was allergic  . Latex Other (See Comments)    Rash, swelling itching  . Phenergan [Promethazine Hcl] Anxiety    Received IV in past, high anxiety, prefers not to take again.    Prescriptions Prior to Admission   Medication Sig Dispense Refill Last Dose  . pantoprazole (PROTONIX) 40 MG tablet Take 40 mg by mouth daily.   Past Week at Unknown time  . Prenatal Vit-Fe Fumarate-FA (PRENATAL MULTIVITAMIN) TABS tablet Take 1 tablet by mouth daily at 12 noon.   04/27/2017 at Unknown time    Review of Systems  Eyes: Positive for visual disturbance.  Gastrointestinal: Positive for abdominal pain (ctx).  Genitourinary: Negative for vaginal bleeding.  Neurological: Positive for headaches.   Physical Exam   Blood pressure (!) 143/100, pulse (!) 106, temperature 98.8 F (37.1 C), temperature source Oral, resp. rate 18, height 5\' 5"  (1.651 m), weight 265 lb (120.2 kg), unknown if currently breastfeeding.  Patient Vitals for the past 24 hrs:  BP Temp Temp src Pulse Resp Height Weight  05/09/17 2331 (!) 147/91 - - 91 - - -  05/09/17 2316 129/83 - - (!) 107 - - -  05/09/17 2301 129/79 - - (!) 105 - - -  05/09/17 2246 136/86 - - (!) 106 - - -  05/09/17 2231 (!) 141/90 - - Marland Kitchen)  106 - - -  05/09/17 2216 (!) 143/100 - - (!) 106 - - -  05/09/17 2208 (!) 142/91 - - 98 - - -  05/09/17 2203 (!) 159/103 - - (!) 101 - - -  05/09/17 2151 (!) 142/96 98.8 F (37.1 C) Oral 93 18 5\' 5"  (1.651 m) 265 lb (120.2 kg)    Physical Exam  Constitutional: She is oriented to person, place, and time. She appears well-developed and well-nourished. No distress.  HENT:  Head: Normocephalic and atraumatic.  Neck: Normal range of motion.  Respiratory: Effort normal. No respiratory distress.  GI: Soft. She exhibits no distension. There is no tenderness.  gravid  Genitourinary:  Genitourinary Comments: Cervix 4/60/-2, vtx  Musculoskeletal: Normal range of motion.  Neurological: She is alert and oriented to person, place, and time.  Skin: Skin is warm and dry.  Psychiatric: She has a normal mood and affect.  EFM: 135 bpm, mod variability, + accels, no decels Toco: 4-5  Results for orders placed or performed during the hospital  encounter of 05/09/17 (from the past 24 hour(s))  Protein / creatinine ratio, urine     Status: None   Collection Time: 05/09/17  9:52 PM  Result Value Ref Range   Creatinine, Urine 164.00 mg/dL   Total Protein, Urine 20 mg/dL   Protein Creatinine Ratio 0.12 0.00 - 0.15 mg/mg[Cre]  CBC     Status: Abnormal   Collection Time: 05/09/17 10:19 PM  Result Value Ref Range   WBC 11.9 (H) 4.0 - 10.5 K/uL   RBC 4.33 3.87 - 5.11 MIL/uL   Hemoglobin 11.6 (L) 12.0 - 15.0 g/dL   HCT 62.135.6 (L) 30.836.0 - 65.746.0 %   MCV 82.2 78.0 - 100.0 fL   MCH 26.8 26.0 - 34.0 pg   MCHC 32.6 30.0 - 36.0 g/dL   RDW 84.615.0 96.211.5 - 95.215.5 %   Platelets 225 150 - 400 K/uL  Comprehensive metabolic panel     Status: Abnormal   Collection Time: 05/09/17 10:19 PM  Result Value Ref Range   Sodium 137 135 - 145 mmol/L   Potassium 3.8 3.5 - 5.1 mmol/L   Chloride 104 101 - 111 mmol/L   CO2 25 22 - 32 mmol/L   Glucose, Bld 89 65 - 99 mg/dL   BUN 8 6 - 20 mg/dL   Creatinine, Ser 8.410.56 0.44 - 1.00 mg/dL   Calcium 8.7 (L) 8.9 - 10.3 mg/dL   Total Protein 5.8 (L) 6.5 - 8.1 g/dL   Albumin 2.7 (L) 3.5 - 5.0 g/dL   AST 15 15 - 41 U/L   ALT 12 (L) 14 - 54 U/L   Alkaline Phosphatase 113 38 - 126 U/L   Total Bilirubin 0.7 0.3 - 1.2 mg/dL   GFR calc non Af Amer >60 >60 mL/min   GFR calc Af Amer >60 >60 mL/min   Anion gap 8 5 - 15   MAU Course  Procedures Tylenol  MDM Labs ordered and reviewed. HA improved with Tylenol. No evidence of pre-e. No evidence of labor, cervix unchanged. Presentation, clinical findings, and plan discussed with Dr. Rana SnareLowe. Stable for discharge home.  Assessment and Plan   1. [redacted] weeks gestation of pregnancy   2. NST (non-stress test) reactive   3. Pregnancy-induced hypertension in third trimester   4. False labor    [facility]charge home [facility] Labor precautions Pre-e precautions  Allergies as of 05/10/2017      Reactions  Cephalosporins    Had an allergy test and was told was  allergic   Latex Other (See Comments)   Rash, swelling itching   Phenergan [promethazine Hcl] Anxiety   Received IV in past, high anxiety, prefers not to take again.      Medication List    TAKE these medications   pantoprazole 40 MG tablet Commonly known as:  PROTONIX Take 40 mg by mouth daily.   prenatal multivitamin Tabs tablet Take 1 tablet by mouth daily at 12 noon.      Donette Larry, CNM 05/09/2017, 10:24 PM

## 2017-05-09 NOTE — Progress Notes (Signed)
   05/09/17 2213  Provider Notification  Provider Name/Title Turner DanielsLowe, David C, MD  Method of Notification Phone  Asked by Maureen RalphsMelanie B., cnm to call Dr. Rana SnareLowe. Dr. Rana SnareLowe called and informed [age]yr old G6 P3 at 37wks in to r/o labor. BP elevated, scheduled for IOL 7/16 at [place]. Dr. Rana SnareLowe ordered to get labs, DC pt home if wnl and not in labor

## 2017-05-09 NOTE — MAU Note (Signed)
Pt reports she has been having contractions since about 6pm. Ctx about q 5 min. Denies any vaginal bleeding or SROM. Scheduled for induction for tomorrow for hypertension. 2cm dilated on Thursday.

## 2017-05-10 DIAGNOSIS — Z3A37 37 weeks gestation of pregnancy: Secondary | ICD-10-CM

## 2017-05-10 DIAGNOSIS — Z3689 Encounter for other specified antenatal screening: Secondary | ICD-10-CM

## 2017-05-10 DIAGNOSIS — O133 Gestational [pregnancy-induced] hypertension without significant proteinuria, third trimester: Secondary | ICD-10-CM

## 2017-05-10 DIAGNOSIS — O479 False labor, unspecified: Secondary | ICD-10-CM

## 2017-05-10 NOTE — Discharge Instructions (Signed)
Hypertension During Pregnancy °Hypertension, commonly called high blood pressure, is when the force of blood pumping through your arteries is too strong. Arteries are blood vessels that carry blood from the heart throughout the body. Hypertension during pregnancy can cause problems for you and your baby. Your baby may be born early (prematurely) or may not weigh as much as he or she should at birth. Very bad cases of hypertension during pregnancy can be life-threatening. °Different types of hypertension can occur during pregnancy. These include: °· Chronic hypertension. This happens when: °? You have hypertension before pregnancy and it continues during pregnancy. °? You develop hypertension before you are [redacted] weeks pregnant, and it continues during pregnancy. °· Gestational hypertension. This is hypertension that develops after the 20th week of pregnancy. °· Preeclampsia, also called toxemia of pregnancy. This is a very serious type of hypertension that develops only during pregnancy. It affects the whole body, and it can be very dangerous for you and your baby. ° °Gestational hypertension and preeclampsia usually go away within 6 weeks after your baby is born. Women who have hypertension during pregnancy have a greater chance of developing hypertension later in life or during future pregnancies. °What are the causes? °The exact cause of hypertension is not known. °What increases the risk? °There are certain factors that make it more likely for you to develop hypertension during pregnancy. These include: °· Having hypertension during a previous pregnancy or prior to pregnancy. °· Being overweight. °· Being older than age 40. °· Being pregnant for the first time or being pregnant with more than one baby. °· Becoming pregnant using fertilization methods such as IVF (in vitro fertilization). °· Having diabetes, kidney problems, or systemic lupus erythematosus. °· Having a family history of hypertension. ° °What are the  signs or symptoms? °Chronic hypertension and gestational hypertension rarely cause symptoms. Preeclampsia causes symptoms, which may include: °· Increased protein in your urine. Your health care provider will check for this at every visit before you give birth (prenatal visit). °· Severe headaches. °· Sudden weight gain. °· Swelling of the hands, face, legs, and feet. °· Nausea and vomiting. °· Vision problems, such as blurred or double vision. °· Numbness in the face, arms, legs, and feet. °· Dizziness. °· Slurred speech. °· Sensitivity to bright lights. °· Abdominal pain. °· Convulsions. ° °How is this diagnosed? °You may be diagnosed with hypertension during a routine prenatal exam. At each prenatal visit, you may: °· Have a urine test to check for high amounts of protein in your urine. °· Have your blood pressure checked. A blood pressure reading is recorded as two numbers, such as "120 over 80" (or 120/80). The first ("top") number is called the systolic pressure. It is a measure of the pressure in your arteries when your heart beats. The second ("bottom") number is called the diastolic pressure. It is a measure of the pressure in your arteries as your heart relaxes between beats. Blood pressure is measured in a unit called mm Hg. A normal blood pressure reading is: °? Systolic: below 120. °? Diastolic: below 80. ° °The type of hypertension that you are diagnosed with depends on your test results and when your symptoms developed. °· Chronic hypertension is usually diagnosed before 20 weeks of pregnancy. °· Gestational hypertension is usually diagnosed after 20 weeks of pregnancy. °· Hypertension with high amounts of protein in the urine is diagnosed as preeclampsia. °· Blood pressure measurements that stay above 160 systolic, or above 110 diastolic, are   signs of severe preeclampsia. ° °How is this treated? °Treatment for hypertension during pregnancy varies depending on the type of hypertension you have and how  serious it is. °· If you take medicines called ACE inhibitors to treat chronic hypertension, you may need to switch medicines. ACE inhibitors should not be taken during pregnancy. °· If you have gestational hypertension, you may need to take blood pressure medicine. °· If you are at risk for preeclampsia, your health care provider may recommend that you take a low-dose aspirin every day to prevent high blood pressure during your pregnancy. °· If you have severe preeclampsia, you may need to be hospitalized so you and your baby can be monitored closely. You may also need to take medicine (magnesium sulfate) to prevent seizures and to lower blood pressure. This medicine may be given as an injection or through an IV tube. °· In some cases, if your condition gets worse, you may need to deliver your baby early. ° °Follow these instructions at home: °Eating and drinking °· Drink enough fluid to keep your urine clear or pale yellow. °· Eat a healthy diet that is low in salt (sodium). Do not add salt to your food. Check food labels to see how much sodium a food or beverage contains. °Lifestyle °· Do not use any products that contain nicotine or tobacco, such as cigarettes and e-cigarettes. If you need help quitting, ask your health care provider. °· Do not use alcohol. °· Avoid caffeine. °· Avoid stress as much as possible. Rest and get plenty of sleep. °General instructions °· Take over-the-counter and prescription medicines only as told by your health care provider. °· While lying down, lie on your left side. This keeps pressure off your baby. °· While sitting or lying down, raise (elevate) your feet. Try putting some pillows under your lower legs. °· Exercise regularly. Ask your health care provider what kinds of exercise are best for you. °· Keep all prenatal and follow-up visits as told by your health care provider. This is important. °Contact a health care provider if: °· You have symptoms that your health care  provider told you may require more treatment or monitoring, such as: °? Fever. °? Vomiting. °? Headache. °Get help right away if: °· You have severe abdominal pain or vomiting that does not get better with treatment. °· You suddenly develop swelling in your hands, ankles, or face. °· You gain 4 lbs (1.8 kg) or more in 1 week. °· You develop vaginal bleeding, or you have blood in your urine. °· You do not feel your baby moving as much as usual. °· You have blurred or double vision. °· You have muscle twitching or sudden tightening (spasms). °· You have shortness of breath. °· Your lips or fingernails turn blue. °This information is not intended to replace advice given to you by your health care provider. Make sure you discuss any questions you have with your health care provider. °Document Released: 07/01/2011 Document Revised: 05/02/2016 Document Reviewed: 03/28/2016 °Elsevier Interactive Patient Education © 2018 [employer] ° ° ° ° ° °Braxton Hicks Contractions °Contractions of the uterus can occur throughout pregnancy, but they are not always a sign that you are in labor. You may have practice contractions called Braxton Hicks contractions. These false labor contractions are sometimes confused with true labor. °What are Braxton Hicks contractions? °Braxton Hicks contractions are tightening movements that occur in the muscles of the uterus before labor. Unlike true labor contractions, these contractions do not result   in opening (dilation) and thinning of the cervix. Toward the end of pregnancy (32-34 weeks), Braxton Hicks contractions can happen more often and may become stronger. These contractions are sometimes difficult to tell apart from true labor because they can be very uncomfortable. You should not feel embarrassed if you go to the hospital with false labor. °Sometimes, the only way to tell if you are in true labor is for your health care provider to look for changes in the cervix. The health care  provider will do a physical exam and may monitor your contractions. If you are not in true labor, the exam should show that your cervix is not dilating and your water has not broken. °If there are no prenatal problems or other health problems associated with your pregnancy, it is completely safe for you to be sent home with false labor. You may continue to have Braxton Hicks contractions until you go into true labor. °How can I tell the difference between true labor and false labor? °· Differences °? False labor °? Contractions last 30-70 seconds.: Contractions are usually shorter and not as strong as true labor contractions. °? Contractions become very regular.: Contractions are usually irregular. °? Discomfort is usually felt in the top of the uterus, and it spreads to the lower abdomen and low back.: Contractions are often felt in the front of the lower abdomen and in the groin. °? Contractions do not go away with walking.: Contractions may go away when you walk around or change positions while lying down. °? Contractions usually become more intense and increase in frequency.: Contractions get weaker and are shorter-lasting as time goes on. °? The cervix dilates and gets thinner.: The cervix usually does not dilate or become thin. °Follow these instructions at home: °· Take over-the-counter and prescription medicines only as told by your health care provider. °· Keep up with your usual exercises and follow other instructions from your health care provider. °· Eat and drink lightly if you think you are going into labor. °· If Braxton Hicks contractions are making you uncomfortable: °? Change your position from lying down or resting to walking, or change from walking to resting. °? Sit and rest in a tub of warm water. °? Drink enough fluid to keep your urine clear or pale yellow. Dehydration may cause these contractions. °? Do slow and deep breathing several times an hour. °· Keep all follow-up prenatal visits as  told by your health care provider. This is important. °Contact a health care provider if: °· You have a fever. °· You have continuous pain in your abdomen. °Get help right away if: °· Your contractions become stronger, more regular, and closer together. °· You have fluid leaking or gushing from your vagina. °· You pass blood-tinged mucus (bloody show). °· You have bleeding from your vagina. °· You have low back pain that you never had before. °· You feel your baby’s head pushing down and causing pelvic pressure. °· Your baby is not moving inside you as much as it used to. °Summary °· Contractions that occur before labor are called Braxton Hicks contractions, false labor, or practice contractions. °· Braxton Hicks contractions are usually shorter, weaker, farther apart, and less regular than true labor contractions. True labor contractions usually become progressively stronger and regular and they become more frequent. °· Manage discomfort from Braxton Hicks contractions by changing position, resting in a warm bath, drinking plenty of water, or practicing deep breathing. °This information is not intended to replace advice   given to you by your health care provider. Make sure you discuss any questions you have with your health care provider. °Document Released: 10/13/2005 Document Revised: 09/01/2016 Document Reviewed: 09/01/2016 °Elsevier Interactive Patient Education © 2017 [employer] ° °

## 2017-05-11 ENCOUNTER — Inpatient Hospital Stay (HOSPITAL_COMMUNITY)
Admission: RE | Admit: 2017-05-11 | Discharge: 2017-05-13 | DRG: 775 | Disposition: A | Discharge status: Home / Self Care | Payer: 59 | Source: Ambulatory Visit | Attending: Obstetrics and Gynecology | Admitting: Obstetrics and Gynecology

## 2017-05-11 ENCOUNTER — Encounter (HOSPITAL_COMMUNITY): Payer: 59

## 2017-05-11 ENCOUNTER — Inpatient Hospital Stay (HOSPITAL_COMMUNITY): Admission: RE | Admit: 2017-05-11 | Payer: 59 | Source: Ambulatory Visit

## 2017-05-11 ENCOUNTER — Encounter (HOSPITAL_COMMUNITY): Payer: Self-pay

## 2017-05-11 ENCOUNTER — Inpatient Hospital Stay (HOSPITAL_COMMUNITY): Payer: 59 | Admitting: Anesthesiology

## 2017-05-11 DIAGNOSIS — O9962 Diseases of the digestive system complicating childbirth: Secondary | ICD-10-CM | POA: Diagnosis present

## 2017-05-11 DIAGNOSIS — Z6841 Body Mass Index (BMI) 40.0 and over, adult: Secondary | ICD-10-CM

## 2017-05-11 DIAGNOSIS — K219 Gastro-esophageal reflux disease without esophagitis: Secondary | ICD-10-CM | POA: Diagnosis present

## 2017-05-11 DIAGNOSIS — O99214 Obesity complicating childbirth: Secondary | ICD-10-CM | POA: Diagnosis present

## 2017-05-11 DIAGNOSIS — O99824 Streptococcus B carrier state complicating childbirth: Secondary | ICD-10-CM | POA: Diagnosis present

## 2017-05-11 DIAGNOSIS — O139 Gestational [pregnancy-induced] hypertension without significant proteinuria, unspecified trimester: Secondary | ICD-10-CM | POA: Diagnosis present

## 2017-05-11 DIAGNOSIS — O134 Gestational [pregnancy-induced] hypertension without significant proteinuria, complicating childbirth: Secondary | ICD-10-CM | POA: Diagnosis present

## 2017-05-11 DIAGNOSIS — Z91040 Latex allergy status: Secondary | ICD-10-CM

## 2017-05-11 DIAGNOSIS — Z3A37 37 weeks gestation of pregnancy: Secondary | ICD-10-CM

## 2017-05-11 LAB — COMPREHENSIVE METABOLIC PANEL
ALBUMIN: 2.7 g/dL — AB (ref 3.5–5.0)
ALT: 12 U/L — AB (ref 14–54)
AST: 26 U/L (ref 15–41)
Alkaline Phosphatase: 112 U/L (ref 38–126)
Anion gap: 8 (ref 5–15)
BUN: 9 mg/dL (ref 6–20)
CALCIUM: 8.6 mg/dL — AB (ref 8.9–10.3)
CO2: 22 mmol/L (ref 22–32)
CREATININE: 0.42 mg/dL — AB (ref 0.44–1.00)
Chloride: 104 mmol/L (ref 101–111)
GFR calc Af Amer: >60 mL/min (ref >60)
GFR calc non Af Amer: >60 mL/min (ref >60)
GLUCOSE: 95 mg/dL (ref 65–99)
Potassium: 4.6 mmol/L (ref 3.5–5.1)
SODIUM: 134 mmol/L — AB (ref 135–145)
Total Bilirubin: 1.3 mg/dL — ABNORMAL HIGH (ref 0.3–1.2)
Total Protein: 6 g/dL — ABNORMAL LOW (ref 6.5–8.1)

## 2017-05-11 LAB — CBC
HCT: 36.7 % (ref 36.0–46.0)
HEMATOCRIT: 34.4 % — AB (ref 36.0–46.0)
HEMOGLOBIN: 12.1 g/dL (ref 12.0–15.0)
HEMOGLOBIN: 11.3 g/dL — AB (ref 12.0–15.0)
MCH: 26.8 pg (ref 26.0–34.0)
MCH: 26.9 pg (ref 26.0–34.0)
MCHC: 33 g/dL (ref 30.0–36.0)
MCHC: 32.8 g/dL (ref 30.0–36.0)
MCV: 81.4 fL (ref 78.0–100.0)
MCV: 81.9 fL (ref 78.0–100.0)
Platelets: 239 10*3/uL (ref 150–400)
Platelets: 223 10*3/uL (ref 150–400)
RBC: 4.51 MIL/uL (ref 3.87–5.11)
RBC: 4.2 MIL/uL (ref 3.87–5.11)
RDW: 15 % (ref 11.5–15.5)
RDW: 15.1 % (ref 11.5–15.5)
WBC: 11.4 10*3/uL — ABNORMAL HIGH (ref 4.0–10.5)
WBC: 10.4 10*3/uL (ref 4.0–10.5)

## 2017-05-11 LAB — TYPE AND SCREEN
ABO/RH(D): A POS
Antibody Screen: NEGATIVE

## 2017-05-11 MED ORDER — FLEET ENEMA 7-19 GM/118ML RE ENEM: 1.0000 | ENEMA | RECTAL | Status: DC | PRN

## 2017-05-11 MED ORDER — ACETAMINOPHEN 325 MG PO TABS: 650.0000 mg | ORAL_TABLET | ORAL | Status: DC | PRN

## 2017-05-11 MED ORDER — OXYTOCIN BOLUS FROM INFUSION
500.0000 mL | Freq: Once | INTRAVENOUS | Status: AC
  Administered 2017-05-11: 500 mL via INTRAVENOUS

## 2017-05-11 MED ORDER — LACTATED RINGERS IV SOLN
INTRAVENOUS | Status: DC
  Administered 2017-05-11: 08:00:00 via INTRAVENOUS

## 2017-05-11 MED ORDER — OXYCODONE-ACETAMINOPHEN 5-325 MG PO TABS: 1.0000 | ORAL_TABLET | ORAL | Status: DC | PRN

## 2017-05-11 MED ORDER — SENNOSIDES-DOCUSATE SODIUM 8.6-50 MG PO TABS
2.0000 | ORAL_TABLET | ORAL | Status: DC
  Administered 2017-05-11: 2 via ORAL
  Filled 2017-05-11 (×2): qty 2

## 2017-05-11 MED ORDER — IBUPROFEN 600 MG PO TABS
600.0000 mg | ORAL_TABLET | Freq: Four times a day (QID) | ORAL | Status: DC
  Administered 2017-05-11 – 2017-05-13 (×6): 600 mg via ORAL
  Filled 2017-05-11 (×6): qty 1

## 2017-05-11 MED ORDER — LACTATED RINGERS IV SOLN: 500.0000 mL | INTRAVENOUS | Status: DC | PRN

## 2017-05-11 MED ORDER — OXYTOCIN 40 UNITS IN LACTATED RINGERS INFUSION - SIMPLE MED
2.5000 [IU]/h | INTRAVENOUS | Status: DC
  Filled 2017-05-11: qty 1000

## 2017-05-11 MED ORDER — MISOPROSTOL 25 MCG QUARTER TABLET: 25.0000 ug | ORAL_TABLET | ORAL | Status: DC | PRN

## 2017-05-11 MED ORDER — BENZOCAINE-MENTHOL 20-0.5 % EX AERO
1.0000 "application " | INHALATION_SPRAY | CUTANEOUS | Status: DC | PRN
  Administered 2017-05-11: 1 via TOPICAL
  Filled 2017-05-11 (×2): qty 56

## 2017-05-11 MED ORDER — DIBUCAINE 1 % RE OINT: 1.0000 "application " | TOPICAL_OINTMENT | RECTAL | Status: DC | PRN

## 2017-05-11 MED ORDER — DIPHENHYDRAMINE HCL 50 MG/ML IJ SOLN: 12.5000 mg | INTRAMUSCULAR | Status: DC | PRN

## 2017-05-11 MED ORDER — LIDOCAINE HCL (PF) 1 % IJ SOLN
INTRAMUSCULAR | Status: DC | PRN
  Administered 2017-05-11: 3 mL via EPIDURAL
  Administered 2017-05-11: 2 mL via EPIDURAL
  Administered 2017-05-11: 5 mL via EPIDURAL

## 2017-05-11 MED ORDER — EPHEDRINE 5 MG/ML INJ
10.0000 mg | INTRAVENOUS | Status: DC | PRN
  Filled 2017-05-11: qty 2

## 2017-05-11 MED ORDER — DIPHENHYDRAMINE HCL 25 MG PO CAPS: 25.0000 mg | ORAL_CAPSULE | Freq: Four times a day (QID) | ORAL | Status: DC | PRN

## 2017-05-11 MED ORDER — SOD CITRATE-CITRIC ACID 500-334 MG/5ML PO SOLN: 30.0000 mL | ORAL | Status: DC | PRN

## 2017-05-11 MED ORDER — ONDANSETRON HCL 4 MG PO TABS: 4.0000 mg | ORAL_TABLET | ORAL | Status: DC | PRN

## 2017-05-11 MED ORDER — PHENYLEPHRINE 40 MCG/ML (10ML) SYRINGE FOR IV PUSH (FOR BLOOD PRESSURE SUPPORT)
80.0000 ug | PREFILLED_SYRINGE | INTRAVENOUS | Status: DC | PRN
  Filled 2017-05-11: qty 5
  Filled 2017-05-11: qty 10

## 2017-05-11 MED ORDER — OXYCODONE-ACETAMINOPHEN 5-325 MG PO TABS: 2.0000 | ORAL_TABLET | ORAL | Status: DC | PRN

## 2017-05-11 MED ORDER — OXYCODONE HCL 5 MG PO TABS: 5.0000 mg | ORAL_TABLET | ORAL | Status: DC | PRN

## 2017-05-11 MED ORDER — LABETALOL HCL 100 MG PO TABS
100.0000 mg | ORAL_TABLET | Freq: Two times a day (BID) | ORAL | Status: DC
  Filled 2017-05-11: qty 1

## 2017-05-11 MED ORDER — PRENATAL MULTIVITAMIN CH
1.0000 | ORAL_TABLET | Freq: Every day | ORAL | Status: DC
  Administered 2017-05-12: 1 via ORAL
  Filled 2017-05-11: qty 1

## 2017-05-11 MED ORDER — TERBUTALINE SULFATE 1 MG/ML IJ SOLN
0.2500 mg | Freq: Once | INTRAMUSCULAR | Status: DC | PRN
  Filled 2017-05-11: qty 1

## 2017-05-11 MED ORDER — ZOLPIDEM TARTRATE 5 MG PO TABS: 5.0000 mg | ORAL_TABLET | Freq: Every evening | ORAL | Status: DC | PRN

## 2017-05-11 MED ORDER — OXYTOCIN 40 UNITS IN LACTATED RINGERS INFUSION - SIMPLE MED
1.0000 m[IU]/min | INTRAVENOUS | Status: DC
  Administered 2017-05-11: 2 m[IU]/min via INTRAVENOUS

## 2017-05-11 MED ORDER — LIDOCAINE HCL (PF) 1 % IJ SOLN
30.0000 mL | INTRAMUSCULAR | Status: DC | PRN
  Administered 2017-05-11: 30 mL via SUBCUTANEOUS
  Filled 2017-05-11: qty 30

## 2017-05-11 MED ORDER — LACTATED RINGERS IV SOLN: 500.0000 mL | Freq: Once | INTRAVENOUS | Status: DC

## 2017-05-11 MED ORDER — SIMETHICONE 80 MG PO CHEW: 80.0000 mg | CHEWABLE_TABLET | ORAL | Status: DC | PRN

## 2017-05-11 MED ORDER — ONDANSETRON HCL 4 MG/2ML IJ SOLN: 4.0000 mg | Freq: Four times a day (QID) | INTRAMUSCULAR | Status: DC | PRN

## 2017-05-11 MED ORDER — FENTANYL 2.5 MCG/ML BUPIVACAINE 1/10 % EPIDURAL INFUSION (WH - ANES)
14.0000 mL/h | INTRAMUSCULAR | Status: DC | PRN
  Administered 2017-05-11: 14 mL/h via EPIDURAL
  Filled 2017-05-11: qty 100

## 2017-05-11 MED ORDER — CLINDAMYCIN PHOSPHATE 900 MG/50ML IV SOLN
900.0000 mg | Freq: Three times a day (TID) | INTRAVENOUS | Status: DC
  Administered 2017-05-11: 900 mg via INTRAVENOUS
  Filled 2017-05-11 (×3): qty 50

## 2017-05-11 MED ORDER — ONDANSETRON HCL 4 MG/2ML IJ SOLN: 4.0000 mg | INTRAMUSCULAR | Status: DC | PRN

## 2017-05-11 MED ORDER — OXYCODONE HCL 5 MG PO TABS: 10.0000 mg | ORAL_TABLET | ORAL | Status: DC | PRN

## 2017-05-11 MED ORDER — COCONUT OIL OIL
1.0000 "application " | TOPICAL_OIL | Status: DC | PRN
  Administered 2017-05-12: 1 via TOPICAL
  Filled 2017-05-11: qty 120

## 2017-05-11 MED ORDER — TETANUS-DIPHTH-ACELL PERTUSSIS 5-2.5-18.5 LF-MCG/0.5 IM SUSP: 0.5000 mL | Freq: Once | INTRAMUSCULAR | Status: DC

## 2017-05-11 MED ORDER — WITCH HAZEL-GLYCERIN EX PADS: 1.0000 "application " | MEDICATED_PAD | CUTANEOUS | Status: DC | PRN

## 2017-05-11 NOTE — Progress Notes (Signed)
FHT cat one cx 4 (stretchy)/90/-3/well applied AROM clear

## 2017-05-11 NOTE — Progress Notes (Signed)
FHT cat one UCs irregular  BPs 140-150s/90s  Denies vision change or HA, no epigastric pain  Results for orders placed or performed during the hospital encounter of 05/11/17 (from the past 24 hour(s))  CBC     Status: Abnormal   Collection Time: 05/11/17  7:53 AM  Result Value Ref Range   WBC 11.4 (H) 4.0 - 10.5 K/uL   RBC 4.51 3.87 - 5.11 MIL/uL   Hemoglobin 12.1 12.0 - 15.0 g/dL   HCT 81.136.7 91.436.0 - 78.246.0 %   MCV 81.4 78.0 - 100.0 fL   MCH 26.8 26.0 - 34.0 pg   MCHC 33.0 30.0 - 36.0 g/dL   RDW 95.615.0 21.311.5 - 08.615.5 %   Platelets 239 150 - 400 K/uL  Comprehensive metabolic panel     Status: Abnormal   Collection Time: 05/11/17  7:53 AM  Result Value Ref Range   Sodium 134 (L) 135 - 145 mmol/L   Potassium 4.6 3.5 - 5.1 mmol/L   Chloride 104 101 - 111 mmol/L   CO2 22 22 - 32 mmol/L   Glucose, Bld 95 65 - 99 mg/dL   BUN 9 6 - 20 mg/dL   Creatinine, Ser 5.780.42 (L) 0.44 - 1.00 mg/dL   Calcium 8.6 (L) 8.9 - 10.3 mg/dL   Total Protein 6.0 (L) 6.5 - 8.1 g/dL   Albumin 2.7 (L) 3.5 - 5.0 g/dL   AST 26 15 - 41 U/L   ALT 12 (L) 14 - 54 U/L   Alkaline Phosphatase 112 38 - 126 U/L   Total Bilirubin 1.3 (H) 0.3 - 1.2 mg/dL   GFR calc non Af Amer >60 >60 mL/min   GFR calc Af Amer >60 >60 mL/min   Anion gap 8 5 - 15  Type and screen [facility]     Status: None   Collection Time: 05/11/17  7:53 AM  Result Value Ref Range   ABO/RH(D) A POS    Antibody Screen NEG    Sample Expiration 05/14/2017    Clindamycin infusing Pitocin running Will AROM later Reviewed with patient-she states she understands and agrees

## 2017-05-11 NOTE — Anesthesia Preprocedure Evaluation (Signed)
Anesthesia Evaluation    Airway Mallampati: II  TM Distance: >3 FB Neck ROM: Full    Dental  (+) Teeth Intact, Dental Advisory Given   Pulmonary    Pulmonary exam normal breath sounds clear to auscultation       Cardiovascular hypertension (Gestational, no meds), Normal cardiovascular exam Rhythm:Regular Rate:Normal     Neuro/Psych  Headaches, PSYCHIATRIC DISORDERS Anxiety    GI/Hepatic GERD  ,  Endo/Other  diabetes (with previous pregnancies), GestationalMorbid obesity  Renal/GU      Musculoskeletal   Abdominal   Peds  Hematology  (+) Blood dyscrasia, anemia , Plt 223k   Anesthesia Other Findings   Reproductive/Obstetrics                            Anesthesia Physical Anesthesia Plan  ASA: III  Anesthesia Plan: Epidural   Post-op Pain Management:    Induction:   PONV Risk Score and Plan:   Airway Management Planned:   Additional Equipment:   Intra-op Plan:   Post-operative Plan:   Informed Consent: I have reviewed the patients History and Physical, chart, labs and discussed the procedure including the risks, benefits and alternatives for the proposed anesthesia with the patient or authorized representative who has indicated his/her understanding and acceptance.   Dental advisory given  Plan Discussed with:   Anesthesia Plan Comments: (Patient identified. Risks/Benefits/Options discussed with patient including but not limited to bleeding, infection, nerve damage, paralysis, failed block, incomplete pain control, headache, blood pressure changes, nausea, vomiting, reactions to medication both or allergic, itching and postpartum back pain. Confirmed with bedside nurse the patient's most recent platelet count. Confirmed with patient that they are not currently taking any anticoagulation, have any bleeding history or any family history of bleeding disorders. Patient expressed  understanding and wished to proceed. All questions were answered. )        Anesthesia Quick Evaluation

## 2017-05-11 NOTE — Anesthesia Pain Management Evaluation Note (Signed)
  CRNA Pain Management Visit Note  Patient: [name], 31 y.o., female  "Hello I am a [facility]. We have an anesthesia team available at all times to provide care throughout the hospital, including epidural management and anesthesia for C-section. I don't know your plan for the delivery whether it a natural birth, water birth, IV sedation, nitrous supplementation, doula or epidural, but we want to meet your pain goals."   1.Was your pain managed to your expectations on prior hospitalizations?   Yes   2.What is your expectation for pain management during this hospitalization?     Epidural  3.How can we help you reach that goal? epidural  Record the patient's initial score and the patient's pain goal.   Pain: 0  Pain Goal: 4 [facility] wants you to be able to say your pain was always managed very well.  Edon Hoadley 05/11/2017

## 2017-05-11 NOTE — H&P (Signed)
[name] is a 31 y.o. female presenting for IOL.  Pregnancy complicted by gestational HTN.  No HA, n/v or RUQ pain.  BP in office 150-160s/90s.  Pt w/ h/o GHTN on ASA with this pregnancy  OB History    Gravida Para Term Preterm AB Living   6 3 2 1 2 3    SAB TAB Ectopic Multiple Live Births   2     0 3     Past Medical History:  Diagnosis Date  . Acid reflux   . Anxiety   . Classic migraine 09/12/2014  . Gestational diabetes    no meds  . History of gestational hypertension   . Hx of varicella   . Hypertension    no meds  . Infertility management   . Vaginal Pap smear, abnormal    Past Surgical History:  Procedure Laterality Date  . DILATION AND CURETTAGE OF UTERUS    . LEEP    . TONSILLECTOMY     Family History: family history includes Cancer in her maternal grandmother; Cervical cancer in her mother; Colon polyps in her mother; Diabetes in her father, maternal grandmother, and mother; Heart disease in her father; Hypertension in her father; Hypothyroidism in her daughter; Kidney disease in her maternal grandmother; Liver disease in her father; Migraines in her daughter. Social History:  reports that she has never smoked. She has never used smokeless tobacco. She reports that she does not drink alcohol or use drugs.     Maternal Diabetes: No Genetic Screening: Normal Maternal Ultrasounds/Referrals: Normal Fetal Ultrasounds or other Referrals:  None Maternal Substance Abuse:  No Significant Maternal Medications:  None Significant Maternal Lab Results:  None Other Comments:  None  ROS History Dilation: 3.5 Effacement (%): 50 Station: -3 Exam by:: [name] ShaveYancey Luft RN  Blood pressure (!) 151/98, pulse 97, temperature 97.8 F (36.6 C), temperature source Oral, resp. rate 18, height 5\' 5"  (1.651 m), weight 260 lb (117.9 kg), unknown if currently breastfeeding. Exam Physical Exam  Gen - NAD Abd - gravid, NT Ext - NT, 1+ bilat edema Cvx 3.5 cm by RN exam Prenatal  labs: ABO, Rh: A/Positive/-- (01/09 0000) Antibody: Negative (01/09 0000) Rubella: Equivocal (01/09 0000) RPR: Nonreactive (01/09 0000)  HBsAg: Negative (01/09 0000)  HIV: Non-reactive (01/09 0000)  GBS: Positive (07/02 0000)   Assessment/Plan: Admit Clinda for GBS + Pitocin AROM after 1st dose clinda Epidural prn   [name] 05/11/2017, 8:10 AM

## 2017-05-11 NOTE — Progress Notes (Signed)
Delivery Note At 4:05 PM a viable female was delivered via Vaginal, Spontaneous Delivery (Presentation: ;  ).  APGAR: 9, 9; weight  .   Placenta status: intact, .  Cord:3 vessels to pathology  with the following complications: .  Cord pH: pending  Anesthesia:  epidural Episiotomy: None Lacerations:  Second degree ML Lac repaired Suture Repair: 2.0 vicryl rapide Est. Blood Loss (mL):  250  Mom to postpartum.  Baby to Couplet care / Skin to Skin.  Ted Goodner II,Rabia Argote E 05/11/2017, 4:23 PM

## 2017-05-11 NOTE — Anesthesia Procedure Notes (Signed)
Epidural Patient location during procedure: OB Start time: 05/11/2017 3:02 PM End time: 05/11/2017 3:07 PM  Staffing Anesthesiologist: Cecile HearingURK, STEPHEN EDWARD Performed: anesthesiologist   Preanesthetic Checklist Completed: patient identified, pre-op evaluation, timeout performed, IV checked, risks and benefits discussed and monitors and equipment checked  Epidural Patient position: sitting Prep: DuraPrep Patient monitoring: blood pressure and continuous pulse ox Approach: midline Location: L2-L3 Injection technique: LOR air  Needle:  Needle type: Tuohy  Needle gauge: 17 G Needle length: 9 cm Needle insertion depth: 7 cm Catheter size: 19 Gauge Catheter at skin depth: 12 cm Test dose: negative and Other (1% Lidocaine)  Additional Notes Patient identified.  Risk benefits discussed including failed block, incomplete pain control, headache, nerve damage, paralysis, blood pressure changes, nausea, vomiting, reactions to medication both toxic or allergic, and postpartum back pain.  Patient expressed understanding and wished to proceed.  All questions were answered.  Sterile technique used throughout procedure and epidural site dressed with sterile barrier dressing. No paresthesia or other complications noted. The patient did not experience any signs of intravascular injection such as tinnitus or metallic taste in mouth nor signs of intrathecal spread such as rapid motor block. Please see nursing notes for vital signs. Reason for block:procedure for pain

## 2017-05-11 NOTE — Anesthesia Postprocedure Evaluation (Signed)
Anesthesia Post Note  Patient: [name]  Procedure(s) Performed: * No procedures listed *     Patient location during evaluation: Mother Baby Anesthesia Type: Epidural Level of consciousness: awake and alert and oriented Pain management: pain level controlled Vital Signs Assessment: post-procedure vital signs reviewed and stable Respiratory status: spontaneous breathing and nonlabored ventilation Cardiovascular status: stable Postop Assessment: no headache, no backache, patient able to bend at knees, epidural receding, no signs of nausea or vomiting and adequate PO intake Anesthetic complications: no    Last Vitals:  Vitals:   05/11/17 1800 05/11/17 1843  BP: 140/77 137/80  Pulse: 83 85  Resp: 18 18  Temp: 36.7 C 36.6 C    Last Pain:  Vitals:   05/11/17 1847  TempSrc:   PainSc: 2    Pain Goal:                 Laban EmperorMalinova,[name]

## 2017-05-12 ENCOUNTER — Inpatient Hospital Stay (HOSPITAL_COMMUNITY): Admit: 2017-05-12 | Payer: 59

## 2017-05-12 LAB — CBC
HEMATOCRIT: 33.2 % — AB (ref 36.0–46.0)
HEMOGLOBIN: 10.9 g/dL — AB (ref 12.0–15.0)
MCH: 27 pg (ref 26.0–34.0)
MCHC: 32.8 g/dL (ref 30.0–36.0)
MCV: 82.4 fL (ref 78.0–100.0)
Platelets: 219 10*3/uL (ref 150–400)
RBC: 4.03 MIL/uL (ref 3.87–5.11)
RDW: 15.2 % (ref 11.5–15.5)
WBC: 10.8 10*3/uL — ABNORMAL HIGH (ref 4.0–10.5)

## 2017-05-12 LAB — COMPREHENSIVE METABOLIC PANEL
ALBUMIN: 2.5 g/dL — AB (ref 3.5–5.0)
ALK PHOS: 95 U/L (ref 38–126)
ALT: 11 U/L — ABNORMAL LOW (ref 14–54)
AST: 16 U/L (ref 15–41)
Anion gap: 5 (ref 5–15)
BILIRUBIN TOTAL: 0.5 mg/dL (ref 0.3–1.2)
BUN: 10 mg/dL (ref 6–20)
CALCIUM: 8.9 mg/dL (ref 8.9–10.3)
CO2: 25 mmol/L (ref 22–32)
CREATININE: 0.58 mg/dL (ref 0.44–1.00)
Chloride: 106 mmol/L (ref 101–111)
GFR calc Af Amer: >60 mL/min (ref >60)
GLUCOSE: 119 mg/dL — AB (ref 65–99)
Potassium: 3.9 mmol/L (ref 3.5–5.1)
Sodium: 136 mmol/L (ref 135–145)
TOTAL PROTEIN: 5.6 g/dL — AB (ref 6.5–8.1)

## 2017-05-12 LAB — RPR: RPR: NONREACTIVE

## 2017-05-12 MED ORDER — PANTOPRAZOLE SODIUM 40 MG PO TBEC
40.0000 mg | DELAYED_RELEASE_TABLET | Freq: Every day | ORAL | Status: DC
  Administered 2017-05-12: 40 mg via ORAL
  Filled 2017-05-12: qty 1

## 2017-05-12 NOTE — Lactation Note (Signed)
This note was copied from a baby's chart. Lactation Consultation Note  Patient Name: Boy Jenean LindauStephanie Duross UXLKG'MToday's Date: 05/12/2017 Reason for consult: Initial assessment   Initial consult at 4419 hrs old; Mom is P4 with experience breastfeeding 2 previous babies for 22 & 10 months.  With last baby mom's milk significantly decreased d/t pregnant with current baby.   Mom is GDM; GHTN induced at 37.2 with this pregnancy.   GA 37.2; BW 7 lbs, 1.9 oz; 4% weight loss but has had lots of voids and stools within first 24 hrs.  Circumcised this am. Infant has breastfed x4 (11-20 min) + attempt x3 (7-8 min) since birth; voids-8; stools-3 since birth.   Mom stated right nipple was slightly sore at bottom with latching.  Upon assessment noted what appeared to be non-blanching positional stripe.  Mom has been using cross-cradle hold.  LC suggested trying football hold on right side and to check flanging of bottom lip.  Mom has coconut oil she has been using.  Encouraged pre-hand-expressing prior to latching to start flow.   Mom independently demonstrated hand expressing; drops of colostrum expressed from breast.   Infant was circumcised this morning.  While infant was out of room, mom used DEBP and pumped 5 ml colostrum she plans to spoon feed to infant later.   Mom has large breast with everted nipples.   Infant began cueing while LC in room and mom wanted LC to watch latch.  Mom latched infant in football hold on left side using asymmetrical latching technique providing good support of both baby and breast; wide mouth; flanged lips.   [facility].  Rhythmical sucking pattern; Several swallows heard; LS-10 by LC.   Reviewed continuing to feed with cues, size of infant's stomach, and cluster feeding.   Informed of hospital support group and OP services.  Mom used both frequently with previous baby.   Mom is very experienced and knowledgeable breastfeeder. Encouraged to call for  questions or concerns after discharge.       Maternal Data Has patient been taught Hand Expression?: Yes (mom experience and can independently HE colostrum) Does the patient have breastfeeding experience prior to this delivery?: Yes  Feeding Feeding Type: Breast Fed Length of feed: 10 min  LATCH Score/Interventions Latch: Grasps breast easily, tongue down, lips flanged, rhythmical sucking.  Audible Swallowing: Spontaneous and intermittent  Type of Nipple: Everted at rest and after stimulation  Comfort (Breast/Nipple): Soft / non-tender     Hold (Positioning): No assistance needed to correctly position infant at breast.  LATCH Score: 10  Lactation Tools Discussed/[facility] No Pump Review: Setup, frequency, and cleaning;Milk Storage;Other (comment) Initiated by:: BDaly,RN Date initiated:: 05/12/17   Consult Status Consult Status: PRN Date: 05/13/17 Follow-up type: In-patient    Lendon KaVann, Maleeah Walker 05/12/2017, 12:05 PM

## 2017-05-12 NOTE — Progress Notes (Signed)
Post Partum Day 1 Subjective: no complaints, up ad lib, voiding and tolerating PO.  Pt having increased cramps.  Desires circ No HA, n/v or visual changes  Objective: Blood pressure (!) 139/92, pulse 84, temperature 98 F (36.7 C), temperature source Oral, resp. rate 18, height 5\' 5"  (1.651 m), weight 260 lb (117.9 kg), SpO2 99 %, unknown if currently breastfeeding.  Physical Exam:  General: alert and cooperative Lochia: appropriate Uterine Fundus: firm Incision: n/a DVT Evaluation: No evidence of DVT seen on physical exam.   Recent Labs  05/11/17 1413 05/12/17 0458  HGB 11.3* 10.9*  HCT 34.4* 33.2*    Assessment/Plan: Plan for discharge tomorrow, Breastfeeding and Circumcision prior to discharge Watch BP closely - no need for antihypertensives at this point  LOS: 1 day   [name] 05/12/2017, 8:08 AM

## 2017-05-12 NOTE — Progress Notes (Signed)
Patient requested to pump to stimulate milk production. Patient is an experienced breastfeeding mother and reports breastfeeding her last 2 babies for 10-22 months and having a very good milk supply. Patient has a DEBP at home. Instructed on pumping, cleaning equipment and storage. Mother requested a curved-tip syringe to provide EBM to baby. Discussed finger feeding with syringe. Parents also report experience with spoon feeding.

## 2017-05-12 NOTE — Progress Notes (Signed)
MOB was referred for history of depression/anxiety. * Referral screened out by Clinical Social Worker because none of the following criteria appear to apply: ~ History of anxiety/depression during this pregnancy, or of post-partum depression. ~ Diagnosis of anxiety and/or depression within last 3 years OR * MOB's symptoms currently being treated with medication and/or therapy.  CSW provided education regarding Baby Blues vs PMADs and provided MOB with information about support groups held at [facility].  CSW encouraged MOB to evaluate her mental health throughout the postpartum period with the use of the New Mom Checklist developed by Postpartum Progress and notify a medical professional if symptoms arise.    Please contact the Clinical Social Worker if needs arise, or if MOB requests.  Kendle Erker Boyd-Gilyard, MSW, LCSW Clinical Social Work [phone]  

## 2017-05-13 NOTE — Discharge Summary (Signed)
Obstetric Discharge Summary Reason for Admission: induction of labor Prenatal Procedures: none Intrapartum Procedures: spontaneous vaginal delivery Postpartum Procedures: none Complications-Operative and Postpartum: second degree perineal laceration Hemoglobin  Date Value Ref Range Status  05/12/2017 10.9 (L) 12.0 - 15.0 g/dL Final   HCT  Date Value Ref Range Status  05/12/2017 33.2 (L) 36.0 - 46.0 % Final    Physical Exam:  General: alert, cooperative and appears stated age 44Lochia: appropriate Uterine Fundus: firm Incision: healing well, no significant drainage, no dehiscence, no significant erythema DVT Evaluation: No evidence of DVT seen on physical exam. Negative Homan's sign. No cords or calf tenderness. No significant calf/ankle edema.  Discharge Diagnoses: Term Pregnancy-delivered  Discharge Information: Date: 05/13/2017 Activity: pelvic rest Diet: routine Medications: PNV, Ibuprofen and Colace Condition: stable Instructions: refer to practice specific booklet Discharge to: home   Newborn Data: Live born female  Birth Weight: 7 lb 1.9 oz (3230 g) APGAR: 9, 9  BP check in one week.   Home with mother.  [name] [name] 05/13/2017, 7:58 AM

## 2017-05-13 NOTE — Lactation Note (Signed)
This note was copied from a baby's chart. Lactation Consultation Note  Patient Name: Boy Jenean LindauStephanie Schoppe GMWNU'UToday's Date: 05/13/2017  Mom states baby is breastfeeding well.  Breasts feel fuller this AM.  Denies questions or concerns.  Lactation outpatient services and support information reviewed and encouraged.  Mom recently attended support group with last baby.   Maternal Data    Feeding Feeding Type: Breast Fed Length of feed: 15 min  LATCH Score/Interventions Latch: Grasps breast easily, tongue down, lips flanged, rhythmical sucking. Intervention(s): Breast massage;Breast compression  Audible Swallowing: Spontaneous and intermittent  Type of Nipple: Everted at rest and after stimulation  Comfort (Breast/Nipple): Filling, red/small blisters or bruises, mild/mod discomfort  Problem noted: Mild/Moderate discomfort (initial latch is sore) Interventions (Mild/moderate discomfort): Hand expression  Hold (Positioning): No assistance needed to correctly position infant at breast.  LATCH Score: 9  Lactation Tools Discussed/Used     Consult Status      Huston FoleyMOULDEN, Rodd Heft S 05/13/2017, 10:48 AM

## 2017-05-20 ENCOUNTER — Ambulatory Visit: Payer: Self-pay

## 2017-05-20 NOTE — Lactation Note (Signed)
This note was copied from a baby's chart. Lactation Consult  Mother's reason for visit:  Assess milk transfer Visit Type:  Feeding assessment Appointment Notes:  See below Consult:  Initial Lactation Consultant:  [name] BlalockSharon S Hice  ________________________________________________________________________  [name] FloresBaby's Name:  [name] CheMerrick Thomas Bertram Chambers Date of Birth:  05/11/2017 Pediatrician:  [name] Gender:  female Gestational Age: 5244w2d (At Birth) Birth Weight:  7 lb 1.9 oz (3230 g) Weight at Discharge:   6 lb 8.4                       Date of Discharge:  05/13/2017 There were no vitals filed for this visit. [facility]  6 lb 10 oz at beginning of appt and 6 lb 8 oz     Location:Pediatrician's office Weight today:  6 lb 9.5 oz (2992 grams) with clean diaper      ________________________________________________________________________  Mother's Name: [name] LindauStephanie Chambers Type of delivery:  Vaginal, Spontaneous Delivery Breastfeeding Experience:  Infant has been in the hospital for Hyperbilirubanemia, infant has been pulling on and off the breast Maternal Medical Conditions:  Pregnancy induced hypertension and Infertility (AI with 31 yo, this infant conceived naturally)  ________________________________________________________________________  Breastfeeding History (Post Discharge)  Frequency of breastfeeding:  Every 1-2 hours Duration of feeding:  15-20 minutes  Supplementation  Formula:  Volume 0 ml  Breastmilk:  Volume 10-20 ml Frequency:  A few times in the last few days when in hospital  Method:  Bottle,   Pumping  Type of pump:  Spectra S2 Frequency:  A few times in the last few days Volume:  90 ml in 4 minutes  Infant Intake and Output Assessment  Voids:  11 in 24 hrs.  Color:  Clear yellow Stools:  11 in 24 hrs.  Color:  Green/Yellow- much lighter in the last 24 hours.    ________________________________________________________________________  Maternal Breast Assessment  Breast:  Full, nipple soreness with latch Nipple:  Erect Pain level:  1 Pain interventions:  Bra and coconut oil  _______________________________________________________________________ Feeding Assessment/Evaluation  Initial feeding assessment:  Infant's oral assessment:  WNL  Positioning:  Cross cradle Left breast  LATCH documentation:  Latch:  2 = Grasps breast easily, tongue down, lips flanged, rhythmical sucking.  Audible swallowing:  2 = Spontaneous and intermittent  Type of nipple:  2 = Everted at rest and after stimulation  Comfort (Breast/Nipple):  1 = Filling, red/small blisters or bruises, mild/mod discomfort  Hold (Positioning):  2 = No assistance needed to correctly position infant at breast  LATCH score:  9  Attached assessment:  Shallow  Lips flanged:  Yes.    Lips untucked:  Yes.    Suck assessment:  Displays both  Tools:  Pump   Pre-feed weight:  2992 g  (6 lb. 9.5 oz.) Post-feed weight:  3040 g (6 lb. 11.2 oz.) Amount transferred:  48 ml Amount supplemented:  0 ml  Additional Feeding Assessment -   Infant's oral assessment:  WNL  Positioning:  Cross cradle Right breast  LATCH documentation:  Latch:  2 = Grasps breast easily, tongue down, lips flanged, rhythmical sucking.  Audible swallowing:  2 = Spontaneous and intermittent  Type of nipple:  2 = Everted at rest and after stimulation  Comfort (Breast/Nipple):  1 = Filling, red/small blisters or bruises, mild/mod discomfort  Hold (Positioning):  2 = No assistance needed to correctly position infant at breast  LATCH score:  9  Attached assessment:  Deep  Lips flanged:  Yes.    Lips untucked:  No.  Suck assessment:  Displays both   Pre-feed weight:  3040 g  (6 lb. 11.2 oz.) Post-feed weight:  3086 g (6 lb. 12.8 oz.) Amount transferred:  46 ml Amount supplemented:  0 ml   Total  amount transferred:  94 ml Total supplement given:  0 ml  Initial OP feeding assessment with Exp BF mom of 9 day old early term infant now 4738 w 2 d CGA. Infant was in the hospital this week for Hyperbilirubanemia that has trended downwards. Infant jaundiced appearing, he is being followed by Pediatrician.   Mom called and made appt this morning as was concerned that infant was not eating well last night. Mom was supplementing small amounts with bottle while infant in the hospital of 1/2 - 1 oz 3-4 x a day. Mom has pumped some for comfort and is noted to have good milk volume. Mom with large compressible breasts with long everted nipples. She is experiencing milk pain with initial latch that improves with feeding. She is using EBM and coconut oil to nipples.   Infant latched to left breast and fed for 10 minutes, he transferred 48 ml. Mom had Milkees on the right breast and collected 20 ml with letdown that was saved for later. Infant was then fed on the right breast for 10 minutes and transferred an additional 48 ml. Infant needs approx 55-75 ml/feeding if eating every 3 hours or 450-600 ml/day for growth. Mom was independent with feeding infant and is pleased to see he did so well.   Discussed with mom that if infant having a hard time feeding at the breast to pump and give him a bottle. She is not pumping regularly and do not feel she needs to at this time. We discussed monitoring voids/stools, watching for infant satisfaction post feeding, not being able to awaken infant with feeds and weights for determining if getting enough food.   [facility] on arrival with no diaper and weighed 6 lb 10 oz and was reweighed with no diaper at the end of the visit weighing 6 lb 8 oz. Mom reports he had voided and stooled in between weights. Weight today 6 lb 9.5 oz with clean diaper before feeding.   Infant with follow up Ped appt Monday for weight and bilirubin check. Enc mom  to come to BF Support Groups on Monday for weight and support, [place] and [place] 3 other children so not sure if she can. Mom is aware she can call and make follow up appt as needed.   Written plan given to mom:  Continue to feed infant at the breast with feeding cues.  Keep awake at the breast as needed Pump for comfort as needed  Pump to offer bottle if infant will not feed at the breast Call for assistance/questions/concerns as needed Keep up the great work!!

## 2017-07-07 ENCOUNTER — Telehealth (HOSPITAL_COMMUNITY): Payer: Self-pay | Admitting: Lactation Services

## 2017-07-07 NOTE — Telephone Encounter (Signed)
Mom called and stated she has been having "yeast" symptoms for 2 weeks.  She called her OB's office and the person she spoke with on the phone told her to put Monistat on her breast.  Mom stated she tried that but it did not help and is now calling hospital LC to find out what she can do.  Mom states her symptoms are burning throughout breast tissue and itching on surface.  LC encouraged mom to seek appointment from both OB and Pediatrician because if it is a fungal infection, then both mom and baby will need to be treated.   Mom states baby uses pacifier.  She states she does not see anything in baby's mouth but states his bottom is reddened.   LC instructed mom to boil pacifier every day and to replace pacifier every 2 days until all symptoms are resolved.  Also instructed mom to wash all clothing that comes in contact with infant's mouth or mom's milk in chlorox or 1 cup of vinegar in washing machine.   LC encouraged mom to rinse or dab with cotton swab breast and nipples after each feeding with a 1:4, vinegar:water mixture.   LC encouraged mom to contact OB for prescription and told mom OB may write for both a topical and oral treatment since mom is exhibiting some internal ductal pain in addition to topical pain.  LC also emphasized that baby will need to be treated too at the same time.

## 2017-07-13 NOTE — Telephone Encounter (Signed)
Mom reports using compound cream, APNO, 2-3 times daily.  Burning shooting pains are gone. Nipples are starting to hurt more, right is worse.  Mom woke up knot on right breast.  Nipple more pink than normal.  Baby is 2 months. Discussed plugged duct treatment.  Mom aware of symptoms to call OB regarding mastitis as needed.  Baby has had nystatin QID ordered, but only had one dose.   LC reviewed plan to treat yeast and mom agrees she needs to put more effort into it.  Mom has 4 children and reports busy.  LC discussed rest when she can. Mom plans to attend support group.

## 2017-07-14 ENCOUNTER — Telehealth (HOSPITAL_COMMUNITY): Payer: Self-pay | Admitting: Lactation Services

## 2017-07-14 NOTE — Telephone Encounter (Signed)
Returned mom's call. She reports someone has already called her. She reports she had planned to come to support group today. She reports the plug is resolved and she has no further questions/concerns at this time.

## 2018-02-03 ENCOUNTER — Ambulatory Visit: Payer: Self-pay | Admitting: Gastroenterology

## 2019-02-23 ENCOUNTER — Other Ambulatory Visit: Payer: Self-pay | Admitting: *Deleted

## 2022-09-25 ENCOUNTER — Encounter: Payer: Self-pay | Admitting: Gastroenterology

## 2022-09-25 ENCOUNTER — Ambulatory Visit (INDEPENDENT_AMBULATORY_CARE_PROVIDER_SITE_OTHER): Payer: PRIVATE HEALTH INSURANCE | Admitting: Gastroenterology

## 2022-09-25 ENCOUNTER — Other Ambulatory Visit (INDEPENDENT_AMBULATORY_CARE_PROVIDER_SITE_OTHER): Payer: PRIVATE HEALTH INSURANCE

## 2022-09-25 VITALS — BP 124/68 | HR 85 | Ht 65.0 in

## 2022-09-25 DIAGNOSIS — K5900 Constipation, unspecified: Secondary | ICD-10-CM

## 2022-09-25 DIAGNOSIS — R1013 Epigastric pain: Secondary | ICD-10-CM

## 2022-09-25 DIAGNOSIS — K219 Gastro-esophageal reflux disease without esophagitis: Secondary | ICD-10-CM

## 2022-09-25 DIAGNOSIS — K648 Other hemorrhoids: Secondary | ICD-10-CM

## 2022-09-25 LAB — CBC WITH DIFFERENTIAL/PLATELET
Basophils Absolute: 0 10*3/uL (ref 0.0–0.1)
Basophils Relative: 0.3 % (ref 0.0–3.0)
Eosinophils Absolute: 0.1 10*3/uL (ref 0.0–0.7)
Eosinophils Relative: 1.5 % (ref 0.0–5.0)
HCT: 39.9 % (ref 36.0–46.0)
Hemoglobin: 13.7 g/dL (ref 12.0–15.0)
Lymphocytes Relative: 41.2 % (ref 12.0–46.0)
Lymphs Abs: 3 10*3/uL (ref 0.7–4.0)
MCHC: 34.2 g/dL (ref 30.0–36.0)
MCV: 84.9 fl (ref 78.0–100.0)
Monocytes Absolute: 0.3 10*3/uL (ref 0.1–1.0)
Monocytes Relative: 4.8 % (ref 3.0–12.0)
Neutro Abs: 3.8 10*3/uL (ref 1.4–7.7)
Neutrophils Relative %: 52.2 % (ref 43.0–77.0)
Platelets: 276 10*3/uL (ref 150.0–400.0)
RBC: 4.7 Mil/uL (ref 3.87–5.11)
RDW: 13.5 % (ref 11.5–15.5)
WBC: 7.2 10*3/uL (ref 4.0–10.5)

## 2022-09-25 LAB — COMPREHENSIVE METABOLIC PANEL
ALT: 13 U/L (ref 0–35)
AST: 10 U/L (ref 0–37)
Albumin: 4.1 g/dL (ref 3.5–5.2)
Alkaline Phosphatase: 55 U/L (ref 39–117)
BUN: 11 mg/dL (ref 6–23)
CO2: 27 mEq/L (ref 19–32)
Calcium: 8.7 mg/dL (ref 8.4–10.5)
Chloride: 104 mEq/L (ref 96–112)
Creatinine, Ser: 0.61 mg/dL (ref 0.40–1.20)
GFR: 114.64 mL/min (ref >60.00)
Glucose, Bld: 105 mg/dL — ABNORMAL HIGH (ref 70–99)
Potassium: 3.9 mEq/L (ref 3.5–5.1)
Sodium: 137 mEq/L (ref 135–145)
Total Bilirubin: 0.9 mg/dL (ref 0.2–1.2)
Total Protein: 6.8 g/dL (ref 6.0–8.3)

## 2022-09-25 LAB — TSH: TSH: 1.93 u[IU]/mL (ref 0.35–5.50)

## 2022-09-25 LAB — LIPASE: Lipase: 16 U/L (ref 11.0–59.0)

## 2022-09-25 MED ORDER — DICYCLOMINE HCL 10 MG PO CAPS: 10.0000 mg | ORAL_CAPSULE | Freq: Three times a day (TID) | ORAL | 11 refills | Status: AC

## 2022-09-25 MED ORDER — FAMOTIDINE 40 MG PO TABS: 40.0000 mg | ORAL_TABLET | Freq: Every day | ORAL | 11 refills | Status: AC

## 2022-09-25 MED ORDER — NA SULFATE-K SULFATE-MG SULF 17.5-3.13-1.6 GM/177ML PO SOLN: 1.0000 | Freq: Once | ORAL | 0 refills | Status: AC

## 2022-09-25 NOTE — Patient Instructions (Signed)
Your provider has requested that you go to the basement level for lab work before leaving today. Press "B" on the elevator. The lab is located at the first door on the left as you exit the elevator.  We have sent the following medications to your pharmacy for you to pick up at your convenience: famotidine, dicyclomine and Suprep.  Patient advised to avoid spicy, acidic, citrus, chocolate, mints, fruit and fruit juices.  Limit the intake of caffeine, alcohol and Soda.  Don't exercise too soon after eating.  Don't lie down within 3-4 hours of eating.  Elevate the head of your bed.  You have been scheduled for an endoscopy and colonoscopy. Please follow the written instructions given to you at your visit today. Please pick up your prep supplies at the pharmacy within the next 1-3 days. If you use inhalers (even only as needed), please bring them with you on the day of your procedure.  The [facility] providers would like to encourage you to use [facility] to communicate with providers for non-urgent requests or questions.  Due to long hold times on the telephone, sending your provider a message by [facility] may be a faster and more efficient way to get a response.  Please allow 48 business hours for a response.  Please remember that this is for non-urgent requests.   Due to recent changes in healthcare laws, you may see the results of your imaging and laboratory studies on MyChart before your provider has had a chance to review them.  We understand that in some cases there may be results that are confusing or concerning to you. Not all laboratory results come back in the same time frame and the provider may be waiting for multiple results in order to interpret others.  Please give [place] 48 hours in order for your provider to thoroughly review all the results before contacting the office for clarification of your results.   Thank you for choosing me and [facility].  Venita Lick. Pleas Koch., MD.,  Clementeen Graham

## 2022-09-25 NOTE — Progress Notes (Signed)
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 The risks (including bleeding, perforation, infection, missed lesions, medication reactions and possible hospitalization or surgery if complications occur), benefits, and alternatives to colonoscopy with possible biopsy and possible polypectomy were discussed with the patient and they consent to proceed.  The risks (including bleeding, perforation, infection, missed lesions, medication reactions and possible hospitalization or surgery if complications occur), benefits, and alternatives to endoscopy with possible biopsy and possible dilation were discussed with the patient and they consent to proceed.    CMP, CBC, TSH, lipase, tTG, IgA  Famotidine 40 mg qd and follow antireflux measures  Metamucil and Colace daily   Dicyclomine 10 mg po tid prn  Long term carb modified, fat modified weight loss diet supervised by her PCP    HPI   Chief complaint: rectal lesion on DRE, constipation alternating with diarrhea, epigastric pain   Patient profile:  [name] is a 36 y.o. female referred by Harold Hedge, MD for a rectal lesion on DRE. She has rectocele as well. She has long history of alternating constipation and diarrhea associated with abdominal bloating. Metamucil and Colace improve her bowel function however she is not consistent with taking them.  She states she has had 2 episodes of severe epigastric pain radiating to the right upper quadrant that lasted several hours.  She relates an abd [place] and CCK HIDA performed in [place] were normal with EF=70%.  She has frequent reflux symptoms with her symptoms responding to PPIs however they cause worsening constipation so she has discontinued them.  She also notes Tums leads  to worsening constipation.  EGD performed in July 2016 as below. Denies weight loss, change in stool caliber, melena, hematochezia, nausea, vomiting, dysphagia, chest pain.   Previous Labs / Imaging::    Latest Ref Rng & Units 05/12/2017    4:58 AM 05/11/2017    2:13 PM 05/11/2017    7:53 AM  CBC  WBC 4.0 - 10.5 K/uL 10.8  10.4  11.4   Hemoglobin 12.0 - 15.0 g/dL 76.2  83.1  51.7   Hematocrit 36.0 - 46.0 % 33.2  34.4  36.7   Platelets 150 - 400 K/uL 219  223  239     Lab Results  Component Value Date   LIPASE 28 08/18/2015      Latest Ref Rng & Units 05/12/2017    4:58 AM 05/11/2017    7:53 AM 05/09/2017   10:19 PM  CMP  Glucose 65 - 99 mg/dL 616  95  89   BUN 6 - 20 mg/dL 10  9  8    Creatinine 0.44 - 1.00 mg/dL  0.73  7.10   Sodium 135 - 145 mmol/L 136  134  137   Potassium 3.5 - 5.1 mmol/L 3.9  4.6  3.8   Chloride 101 - 111 mmol/L 106  104  104   CO2 22 - 32 mmol/L 25  22  25    Calcium 8.9 - 10.3 mg/dL 8.9  8.6  8.7   Total Protein 6.5 - 8.1 g/dL 5.6  6.0  5.8   Total Bilirubin 0.3 - 1.2 mg/dL 0.5  1.3  0.7   Alkaline Phos 38 - 126 U/L 95  112  113   AST 15 - 41 U/L 16  26  15  ALT 14 - 54 U/L 11  12  12       Previous GI evaluation    Endoscopies:  EGD July 2016 erosive gastritis. Path: chronic inactive gastritis, H plyori negative  Imaging:     Past Medical History:  Diagnosis Date   Acid reflux    Anxiety    Classic migraine 09/12/2014   Erosive gastritis    Gestational diabetes    no meds   History of gestational hypertension    Hx of varicella    Hypertension    no meds   Infertility management    Vaginal Pap smear, abnormal    Past Surgical History:  Procedure Laterality Date   DILATION AND CURETTAGE OF UTERUS     LEEP     PARTIAL HYSTERECTOMY     TONSILLECTOMY     Family History  Problem Relation Age of Onset   Diabetes Mother    Cervical cancer Mother    Colon polyps Mother    Diabetes Father    Heart disease Father    Liver  disease Father    Hypertension Father    Migraines Daughter    Hypothyroidism Daughter    Diabetes Maternal Grandmother    Kidney disease Maternal Grandmother        renal failure   Cancer Maternal Grandmother        kidney   Social History   Tobacco Use   Smoking status: Never   Smokeless tobacco: Never  Substance Use Topics   Alcohol use: No   Drug use: No   Current Outpatient Medications  Medication Sig Dispense Refill   pantoprazole (PROTONIX) 40 MG tablet Take 40 mg by mouth daily.     Prenatal Vit-Fe Fumarate-FA (PRENATAL MULTIVITAMIN) TABS tablet Take 1 tablet by mouth daily at 12 noon.     No current facility-administered medications for this visit.   Allergies  Allergen Reactions   Cephalosporins     Had an allergy test and was told was allergic   Latex Other (See Comments)    Rash, swelling itching   Phenergan [Promethazine Hcl] Anxiety    Received IV in past, high anxiety, prefers not to take again.    Review of Systems: All other systems reviewed and negative except where noted in HPI.    Physical Exam    Wt Readings from Last 3 Encounters:  05/11/17 260 lb (117.9 kg)  05/09/17 265 lb (120.2 kg)  04/28/17 261 lb (118.4 kg)    There were no vitals taken for this visit. Constitutional:  Generally well appearing female in no acute distress. HEENT: Pupils normal.  Conjunctivae are normal. No scleral icterus. No oral lesions or deformities noted.  Neck: supple.  Cardiac: Normal rate, regular rhythm. No murmurs. Pulmonary/chest: Effort normal and breath sounds normal. No wheezing, rales or rhonchi. Abdominal: Soft, nondistended, nontender. Active bowel sounds. No palpable HSM, masses or hernias. Rectal and Anoscopy: Small internal hemorrhoids, otherwise normal, no tenderness Extremities: without edema Neurological: Alert and oriented to person, place and time. Psychiatric: Pleasant. Normal mood and affect. Behavior is normal. Skin: Skin is warm and  dry. No rashes noted.  06/29/17, MD   cc:  Referring Provider Claudette Head, MD

## 2022-09-26 LAB — TISSUE TRANSGLUTAMINASE, IGA: (tTG) Ab, IgA: <1 U/mL

## 2022-09-26 LAB — IGA: Immunoglobulin A: 212 mg/dL (ref 47–310)

## 2022-09-27 ENCOUNTER — Encounter: Payer: Self-pay | Admitting: Certified Registered Nurse Anesthetist

## 2022-09-30 ENCOUNTER — Ambulatory Visit (AMBULATORY_SURGERY_CENTER): Payer: PRIVATE HEALTH INSURANCE | Admitting: Gastroenterology

## 2022-09-30 ENCOUNTER — Encounter: Payer: Self-pay | Admitting: Gastroenterology

## 2022-09-30 VITALS — BP 119/66 | HR 65 | Temp 97.5°F | Resp 9 | Ht 65.0 in | Wt 219.6 lb

## 2022-09-30 DIAGNOSIS — R197 Diarrhea, unspecified: Secondary | ICD-10-CM

## 2022-09-30 DIAGNOSIS — K640 First degree hemorrhoids: Secondary | ICD-10-CM

## 2022-09-30 DIAGNOSIS — K629 Disease of anus and rectum, unspecified: Secondary | ICD-10-CM

## 2022-09-30 DIAGNOSIS — R1013 Epigastric pain: Secondary | ICD-10-CM | POA: Diagnosis present

## 2022-09-30 DIAGNOSIS — K5730 Diverticulosis of large intestine without perforation or abscess without bleeding: Secondary | ICD-10-CM | POA: Diagnosis not present

## 2022-09-30 DIAGNOSIS — K5900 Constipation, unspecified: Secondary | ICD-10-CM

## 2022-09-30 DIAGNOSIS — K219 Gastro-esophageal reflux disease without esophagitis: Secondary | ICD-10-CM | POA: Diagnosis not present

## 2022-09-30 DIAGNOSIS — R198 Other specified symptoms and signs involving the digestive system and abdomen: Secondary | ICD-10-CM

## 2022-09-30 MED ORDER — SODIUM CHLORIDE 0.9 % IV SOLN: 500.0000 mL | Freq: Once | INTRAVENOUS | Status: DC

## 2022-09-30 NOTE — Op Note (Signed)
[facility] Patient Name: [name] Procedure Date: 09/30/2022 1:38 PM MRN: [id number] Endoscopist: Meryl Dare , MD, [phone] Age: 36 Referring MD:  Date of Birth: Mar 26, 1986 Gender: Female Account #: [id number] Procedure:                Upper GI endoscopy Indications:              Epigastric abdominal pain, Gastroesophageal reflux                            disease Medicines:                Monitored Anesthesia Care Procedure:                Pre-Anesthesia Assessment:                           - Prior to the procedure, a History and Physical                            was performed, and patient medications and                            allergies were reviewed. The patient's tolerance of                            previous anesthesia was also reviewed. The risks                            and benefits of the procedure and the sedation                            options and risks were discussed with the patient.                            All questions were answered, and informed consent                            was obtained. Prior Anticoagulants: The patient has                            taken no anticoagulant or antiplatelet agents. ASA                            Grade Assessment: II - A patient with mild systemic                            disease. After reviewing the risks and benefits,                            the patient was deemed in satisfactory condition to                            undergo the procedure.  After obtaining informed consent, the endoscope was                            passed under direct vision. Throughout the                            procedure, the patient's blood pressure, pulse, and                            oxygen saturations were monitored continuously. The                            Endoscope was introduced through the mouth, and                            advanced to the second part of duodenum.  The upper                            GI endoscopy was accomplished without difficulty.                            The patient tolerated the procedure well. Scope In: Scope Out: Findings:                 The esophagus was normal.                           The stomach was normal. Biopsies were taken with a                            cold forceps for histology.                           The examined duodenum was normal. Biopsies for                            histology were taken with a cold forceps for                            evaluation of celiac disease.                           The cardia and gastric fundus were normal on                            retroflexion. Complications:            No immediate complications. Estimated Blood Loss:     Estimated blood loss: none. Impression:               - Normal esophagus.                           - Normal stomach. Biopsied.                           - Normal examined duodenum.  Biopsied. Recommendation:           - Patient has a contact number available for                            emergencies. The signs and symptoms of potential                            delayed complications were discussed with the                            patient. Return to normal activities tomorrow.                            Written discharge instructions were provided to the                            patient.                           - Resume previous diet.                           - Follow antireflux measures.                           - Continue present medications.                           - Await pathology results. Meryl Dare, MD 09/30/2022 2:17:39 PM This report has been signed electronically.

## 2022-09-30 NOTE — Progress Notes (Signed)
Called to room to assist during endoscopic procedure.  Patient ID and intended procedure confirmed with present staff. Received instructions for my participation in the procedure from the performing physician.  

## 2022-09-30 NOTE — Progress Notes (Signed)
09/25/2022 H&P, no changes

## 2022-09-30 NOTE — Patient Instructions (Signed)
YOU HAD AN ENDOSCOPIC PROCEDURE TODAY AT THE [facility]:   Refer to the procedure report that was given to you for any specific questions about what was found during the examination.  If the procedure report does not answer your questions, please call your gastroenterologist to clarify.  If you requested that your care partner not be given the details of your procedure findings, then the procedure report has been included in a sealed envelope for you to review at your convenience later.  **Handouts given on Diverticulosis and hemorrhoids**  YOU SHOULD EXPECT: Some feelings of bloating in the abdomen. Passage of more gas than usual.  Walking can help get rid of the air that was put into your GI tract during the procedure and reduce the bloating. If you had a lower endoscopy (such as a colonoscopy or flexible sigmoidoscopy) you may notice spotting of blood in your stool or on the toilet paper. If you underwent a bowel prep for your procedure, you may not have a normal bowel movement for a few days.  Please Note:  You might notice some irritation and congestion in your nose or some drainage.  This is from the oxygen used during your procedure.  There is no need for concern and it should clear up in a day or so.  SYMPTOMS TO REPORT IMMEDIATELY:  Following lower endoscopy (colonoscopy or flexible sigmoidoscopy):  Excessive amounts of blood in the stool  Significant tenderness or worsening of abdominal pains  Swelling of the abdomen that is new, acute  Fever of 100F or higher  Following upper endoscopy (EGD)  Vomiting of blood or coffee ground material  New chest pain or pain under the shoulder blades  Painful or persistently difficult swallowing  New shortness of breath  Fever of 100F or higher  Black, tarry-looking stools  For urgent or emergent issues, a gastroenterologist can be reached at any hour by calling [phone]. Do not use MyChart messaging for urgent concerns.     DIET:  We do recommend a small meal at first, but then you may proceed to your regular diet.  Drink plenty of fluids but you should avoid alcoholic beverages for 24 hours.  ACTIVITY:  You should plan to take it easy for the rest of today and you should NOT DRIVE or use heavy machinery until tomorrow (because of the sedation medicines used during the test).    FOLLOW UP: Our staff will call the number listed on your records the next business day following your procedure.  We will call around 7:15- 8:00 am to check on you and address any questions or concerns that you may have regarding the information given to you following your procedure. If we do not reach you, we will leave a message.     If any biopsies were taken you will be contacted by phone or by letter within the next 1-3 weeks.  Please call us at [phone] if you have not heard about the biopsies in 3 weeks.    SIGNATURES/CONFIDENTIALITY: You and/or your care partner have signed paperwork which will be entered into your electronic medical record.  These signatures attest to the fact that that the information above on your After Visit Summary has been reviewed and is understood.  Full responsibility of the confidentiality of this discharge information lies with you and/or your care-partner.

## 2022-09-30 NOTE — Progress Notes (Signed)
VS completed DT. ? ? ?Pt's states no medical or surgical changes since previsit or office visit. ? ?

## 2022-09-30 NOTE — Progress Notes (Signed)
Report given to PACU, vss 

## 2022-09-30 NOTE — Progress Notes (Signed)
1341 Robinul 0.1 mg IV given due large amount of secretions upon assessment.  MD made aware, vss 

## 2022-09-30 NOTE — Op Note (Signed)
[facility] Patient Name: [name] Procedure Date: 09/30/2022 1:39 PM MRN: [id number] Endoscopist: Meryl Dare , MD, [phone] Age: 36 Referring MD:  Date of Birth: 16-Sep-1986 Gender: Female Account #: [id number] Procedure:                Colonoscopy Indications:              Diarrhea, Constipation, Rectal lesion Medicines:                Monitored Anesthesia Care Procedure:                Pre-Anesthesia Assessment:                           - Prior to the procedure, a History and Physical                            was performed, and patient medications and                            allergies were reviewed. The patient's tolerance of                            previous anesthesia was also reviewed. The risks                            and benefits of the procedure and the sedation                            options and risks were discussed with the patient.                            All questions were answered, and informed consent                            was obtained. Prior Anticoagulants: The patient has                            taken no anticoagulant or antiplatelet agents. ASA                            Grade Assessment: II - A patient with mild systemic                            disease. After reviewing the risks and benefits,                            the patient was deemed in satisfactory condition to                            undergo the procedure.                           After obtaining informed consent, the colonoscope  was passed under direct vision. Throughout the                            procedure, the patient's blood pressure, pulse, and                            oxygen saturations were monitored continuously. The                            Olympus CF-[id number]) Colonoscope was                            introduced through the anus and advanced to the the                            terminal ileum,  with identification of the                            appendiceal orifice and IC valve. The terminal                            ileum, ileocecal valve, appendiceal orifice, and                            rectum were photographed. The quality of the bowel                            preparation was good. The colonoscopy was performed                            without difficulty. The patient tolerated the                            procedure well. Scope In: 1:46:22 PM Scope Out: 1:59:21 PM Scope Withdrawal Time: 0 hours 9 minutes 50 seconds  Total Procedure Duration: 0 hours 12 minutes 59 seconds  Findings:                 The perianal and digital rectal examinations were                            normal.                           The terminal ileum appeared normal.                           A single small-mouthed diverticulum was found in                            the descending colon.                           Internal hemorrhoids were found during  retroflexion. The hemorrhoids were small and Grade                            I (internal hemorrhoids that do not prolapse).                           The exam was otherwise without abnormality on                            direct and retroflexion views. Complications:            No immediate complications. Estimated blood loss:                            None. Estimated Blood Loss:     Estimated blood loss: none. Impression:               - The examined portion of the ileum was normal.                           - Mild diverticulosis in the descending colon.                           - Internal hemorrhoids.                           - The examination was otherwise normal on direct                            and retroflexion views.                           - No specimens collected. Recommendation:           - Repeat colonoscopy at age 40 years for screening                            purposes.                            - Patient has a contact number available for                            emergencies. The signs and symptoms of potential                            delayed complications were discussed with the                            patient. Return to normal activities tomorrow.                            Written discharge instructions were provided to the                            patient.                           -  Resume previous diet.                           - Continue present medications. Meryl DareMalcolm T Shelah Heatley, MD 09/30/2022 2:14:17 PM This report has been signed electronically.

## 2022-10-01 ENCOUNTER — Telehealth: Payer: Self-pay | Admitting: *Deleted

## 2022-10-01 NOTE — Telephone Encounter (Signed)
  Follow up Call-     09/30/2022    1:00 PM  Call back number  Post procedure Call Back phone  # [phone]  Permission to leave phone message Yes     Patient questions:   Phone just kept ringing.

## 2022-10-09 ENCOUNTER — Encounter: Payer: Self-pay | Admitting: Gastroenterology
